# Patient Record
Sex: Male | Born: 1960 | Race: White | Hispanic: No | Marital: Married | Smoking: Never smoker
Health system: Southern US, Community
[De-identification: ages and names within clinical notes are randomized; demographics above are authoritative.]

## PROBLEM LIST (undated history)

## (undated) DIAGNOSIS — E119 Type 2 diabetes mellitus without complications: Secondary | ICD-10-CM

## (undated) DIAGNOSIS — M1990 Unspecified osteoarthritis, unspecified site: Secondary | ICD-10-CM

## (undated) DIAGNOSIS — R7303 Prediabetes: Secondary | ICD-10-CM

## (undated) DIAGNOSIS — K219 Gastro-esophageal reflux disease without esophagitis: Secondary | ICD-10-CM

## (undated) DIAGNOSIS — E785 Hyperlipidemia, unspecified: Secondary | ICD-10-CM

## (undated) HISTORY — DX: Gastro-esophageal reflux disease without esophagitis: K21.9

## (undated) HISTORY — DX: Type 2 diabetes mellitus without complications: E11.9

## (undated) HISTORY — DX: Hyperlipidemia, unspecified: E78.5

---
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
## 2002-12-05 ENCOUNTER — Ambulatory Visit (HOSPITAL_BASED_OUTPATIENT_CLINIC_OR_DEPARTMENT_OTHER): Admission: RE | Admit: 2002-12-05 | Discharge: 2002-12-05 | Payer: Self-pay | Admitting: Orthopedic Surgery

## 2003-01-05 HISTORY — PX: ROTATOR CUFF REPAIR: SHX139

## 2005-12-10 ENCOUNTER — Encounter: Admission: RE | Admit: 2005-12-10 | Discharge: 2005-12-10 | Payer: Self-pay | Admitting: Family Medicine

## 2008-11-11 ENCOUNTER — Encounter: Admission: RE | Admit: 2008-11-11 | Discharge: 2008-11-11 | Payer: Self-pay | Admitting: Chiropractic Medicine

## 2009-04-11 ENCOUNTER — Encounter: Payer: Self-pay | Admitting: Internal Medicine

## 2009-10-30 ENCOUNTER — Encounter: Payer: Self-pay | Admitting: Cardiovascular Disease

## 2009-10-31 ENCOUNTER — Encounter (INDEPENDENT_AMBULATORY_CARE_PROVIDER_SITE_OTHER): Payer: Self-pay | Admitting: *Deleted

## 2009-10-31 ENCOUNTER — Telehealth (INDEPENDENT_AMBULATORY_CARE_PROVIDER_SITE_OTHER): Payer: Self-pay | Admitting: *Deleted

## 2009-11-03 ENCOUNTER — Ambulatory Visit: Payer: Self-pay | Admitting: Internal Medicine

## 2009-12-05 ENCOUNTER — Ambulatory Visit: Payer: Self-pay | Admitting: Internal Medicine

## 2009-12-06 ENCOUNTER — Emergency Department (HOSPITAL_BASED_OUTPATIENT_CLINIC_OR_DEPARTMENT_OTHER): Admission: EM | Admit: 2009-12-06 | Discharge: 2009-12-06 | Payer: Self-pay | Admitting: Emergency Medicine

## 2009-12-08 ENCOUNTER — Encounter: Admission: RE | Admit: 2009-12-08 | Discharge: 2009-12-08 | Payer: Self-pay | Admitting: Family Medicine

## 2009-12-10 ENCOUNTER — Encounter: Payer: Self-pay | Admitting: Internal Medicine

## 2010-01-21 ENCOUNTER — Telehealth: Payer: Self-pay | Admitting: Internal Medicine

## 2010-01-23 ENCOUNTER — Telehealth (INDEPENDENT_AMBULATORY_CARE_PROVIDER_SITE_OTHER): Payer: Self-pay | Admitting: *Deleted

## 2010-02-03 NOTE — Miscellaneous (Signed)
Summary: [facility] PV  Clinical Lists Changes  Observations: Added new observation of NKA: T (11/03/2009 15:54)

## 2010-02-03 NOTE — Letter (Signed)
Summary: EGD Instructions  Bryan [facility]  [address] [place], [place] [address]   Phone: [phone]  Fax: [phone]       Bryan Bradley    [birth year]    MRN: [id number]       Procedure Day /Date: Friday 12/05/2009     Arrival Time:  1:30 pm     Procedure Time:  2:30 pm     Location of Procedure:                    _ x _ [facility] (4th Floor)    PREPARATION FOR ENDOSCOPY   On Friday 12/2 THE DAY OF THE PROCEDURE:  1.   No solid foods, milk or milk products are allowed after midnight the night before your procedure.  2.   Do not drink anything colored red or purple.  Avoid juices with pulp.  No orange juice.  3.  You may drink clear liquids until 12:30 pm, which is 2 hours before your procedure.                                                                                                CLEAR LIQUIDS INCLUDE: Water Jello Ice Popsicles Tea (sugar ok, no milk/cream) Powdered fruit flavored drinks Coffee (sugar ok, no milk/cream) Gatorade Juice: apple, white grape, white cranberry  Lemonade Clear bullion, consomm, broth Carbonated beverages (any kind) Strained chicken noodle soup Hard Candy   MEDICATION INSTRUCTIONS  Unless otherwise instructed, you should take regular prescription medications with a small sip of water as early as possible the morning of your procedure.            OTHER INSTRUCTIONS  You will need a responsible adult at least 51 years of age to accompany you and drive you home.   This person must remain in the waiting room during your procedure.  Wear loose fitting clothing that is easily removed.  Leave jewelry and other valuables at home.  However, you may wish to bring a book to read or an iPod/MP3 player to listen to music as you wait for your procedure to start.  Remove all body piercing jewelry and leave at home.  Total time from sign-in until discharge is approximately 2-3 hours.  You should go  home directly after your procedure and rest.  You can resume normal activities the day after your procedure.  The day of your procedure you should not:   Drive   Make legal decisions   Operate machinery   Drink alcohol   Return to work  You will receive specific instructions about eating, activities and medications before you leave.    The above instructions have been reviewed and explained to me by   Ezra Sites RN  November 03, 2009 4:15 PM   I fully understand and can verbalize these instructions _____________________________ Date _________

## 2010-02-03 NOTE — Procedures (Signed)
Summary: Upper Endoscopy  Patient: Bryan Bradley Note: All result statuses are Final unless otherwise noted.  Tests: (1) Upper Endoscopy (EGD)   EGD Upper Endoscopy       DONE     [facility]     [address]     [place], [place]  [address]           ENDOSCOPY PROCEDURE REPORT           PATIENT:  Bryan Bradley, Bryan Bradley  MR#:  [id number]     BIRTHDATE:  [birth year], 49 yrs. old  GENDER:  male           ENDOSCOPIST:  Hedwig Morton. [name], MD     Referred by:  Blair Heys, M.D.           PROCEDURE DATE:  12/05/2009     PROCEDURE:  EGD with biopsy, 43239     ASA CLASS:  Class I     INDICATIONS:  GERD, abnormal imaging CT of the chest shows     thickened esophagus           MEDICATIONS:   Versed 6 mg, Fentanyl 50 mcg     TOPICAL ANESTHETIC:  Exactacain Spray           DESCRIPTION OF PROCEDURE:   After the risks benefits and     alternatives of the procedure were thoroughly explained, informed     consent was obtained.  The LB GIF-H180 [id number] endoscope was     introduced through the mouth and advanced to the second portion of     the duodenum, without limitations.  The instrument was slowly     withdrawn as the mucosa was fully examined.     <<PROCEDUREIMAGES>>           Esophagitis was found in the distal esophagus. severe ulcerations,     fibrosis and bleeding x 5 cm segment, no significant stricture     r/o Barrett's esophagus Multiple biopsies were obtained and sent     to pathology (see image1, image2, image3, image8, and image9).  A     hiatal hernia was found (see image7). 3-4 cm hiatal hernia 36-40cm     A sessile polyp was found. fundic gland polyps With standard     forceps, a biopsy was obtained and sent to pathology (see image6).     Otherwise the examination was normal (see image5).    Retroflexed     views revealed no abnormalities.    The scope was then withdrawn     from the patient and the procedure completed.           COMPLICATIONS:  None        ENDOSCOPIC IMPRESSION:     1) Esophagitis in the distal esophagus     2) Hiatal hernia     3) Sessile polyp     4) Otherwise normal examination     severe reflux esiphagitis, Grade 3, r/o Barrett's esophagus     RECOMMENDATIONS:     1) Await pathology results     Nexiem 40 mg, #30, 1 po qd, 3 refills           REPEAT EXAM:  In 0 year(s) for.           ______________________________     Hedwig Morton. [name], MD           CC:           n.  eSIGNED:   Hedwig Morton. Brodie at 12/05/2009 03:31 PM           Christia, [place], [phone]  Note: An exclamation mark (!) indicates a result that was not dispersed into the flowsheet. Document Creation Date: 12/05/2009 3:31 PM _______________________________________________________________________  (1) Order result status: Final Collection or observation date-time: 12/05/2009 15:19 Requested date-time:  Receipt date-time:  Reported date-time:  Referring Physician:   Ordering Physician: Lina Sar [phone]) Specimen Source:  Source: Launa Grill Order Number: [id number] Lab site:

## 2010-02-03 NOTE — Progress Notes (Signed)
  Phone Note Outgoing Call Call back at [facility] Phone [phone]   Call placed by: Jesse Fall RN,  October 31, 2009 3:55 PM Summary of Call: Patient scheduled for EGD on 11/07/09 @ 8:30 AM with 7:30 AM arrival as requested by Dr. [name]. Patient scheduled for pre visit on 11/03/09 @ 4 PM. Pt notified of appointments.

## 2010-02-03 NOTE — Letter (Signed)
Summary: Patient Notice-Endo Biopsy Results  [facility]  [address] [place], [place] [address]   Phone: [phone]  Fax: [phone]        December 10, 2009 MRN: [id number]    Regie MESTRE [address] [place], [place]  [address]    Dear Mr. GATT,  I am pleased to inform you that the biopsies taken during your recent endoscopic examination did not show any evidence of cancer upon pathologic examination.The biopsies show that You have rather severe reflux esophagitis. You are taking the right medicatiom for it. When You reduce Your Nexium to one/day, if Your reflux comes back, then You ought to go back to twice a day. We will send You a Prescription to Your drug store if You need one.  Additional information/recommendations:  __No further action is needed at this time.  Please follow-up with      your primary care physician for your other healthcare needs.  _x_ Please call [phone] to schedule a return visit to review      your condition.Please check with me in next 2 months to see if the treatment has been effective  _x_ Continue with the treatment plan as outlined on the day of your      exam.  __   Please call us if you are having persistent problems or have questions about your condition that have not been fully answered at this time.  Sincerely,  [name] MD  This letter has been electronically signed by your physician.  Appended Document: Patient Notice-Endo Biopsy Results Letter mailed

## 2010-02-03 NOTE — Miscellaneous (Signed)
  Clinical Lists Changes 

## 2010-02-05 NOTE — Progress Notes (Signed)
----   Converted from flag ---- ---- 01/23/2010 12:49 PM, [name] MD wrote: I was discussing EGD/colon with the pt. He received a large bill from Pathology after his EGD so he is not ready to have colon now.I told him to have colon within 2012, send him recall letter June 2012 and we will do anothe EGD at that time.  ---- 01/23/2010 11:12 AM, Jesse Fall RN wrote: Dr [name], I just want to be clear on this. Are you discussing the EGD/colon with the patient or Dr. [name]? I told Dr. [name] I would like him know when the procedure was due. Rene Kocher ------------------------------  Phone Note Other Incoming   Summary of Call: Per MD- Patient needs recall for EGD, Colon 06/2010. Recall in [facility]. Initial call taken by: Jesse Fall RN,  January 23, 2010 3:38 PM

## 2010-02-05 NOTE — Progress Notes (Signed)
  Phone Note Other Incoming Call back at [phone]   Caller: Dr [name] Summary of Call: Received a call from Dr. [name]. He would like to coordinate any repeat EGD with the patient's colon sreening. He would like to know if/when Dr. [name] want to repeat EGD.last EGD 12/05/09- reflux esophagitis. Please, advise. Initial call taken by: Jesse Fall RN,  January 21, 2010 11:04 AM  Follow-up for Phone Call        I have left him a message  ,we can coordinate EGD/colon. In next 4-8 weeks, I will call him to discuss. Follow-up by: [name] MD,  January 21, 2010 11:49 AM     Appended Document:  spoke with pt who is feeling fine, no heartburn. But says that he did not have much symptoms prior to the EGD either ( he had severe esophagitis on EGD). He will continue Nexium 40 mg by mouth once daily once daily. He will have a colonoscopy within 1 year and hwe will do the EGD again at that time.  he received a bill from [facility]. Pathology for $ 1000.00 and wants to know whether it was coded right. He is going to e-mail [place] the bills.  Appended Document:  Called to let Dr Randel Books office know that patient will be sent a recall letter for EGD and colon 06/2010.

## 2010-02-10 ENCOUNTER — Telehealth: Payer: Self-pay | Admitting: Internal Medicine

## 2010-02-19 NOTE — Progress Notes (Signed)
Summary: Nexium Rx Prior Authorization  ---- Converted from flag ----  -- 02/08/2010 11:14 AM, [name] MD wrote: Bryan Bradley, Bryan Bradley sent me an e-mail, letting me know that his Insurance company denied his Nexium 40 mg, 1 by mouth two times a day. He would like to appeal it. Could, You, please help him with it?  Thanks. I told him you would let him know and that he will need to find out the tel#  to contact the Insurance. ------------------------------ ---- 02/09/2010 9:33 AM, [name] CMA (AAMA) wrote: Well, the prescription was written by Dr [name], so that is why we were never contacted about insurance denial. Dr [name] wrote for Nexium 40 mg 1 tab ONCE daily, and that is what you also put in your procedure report. However, on the above flag, you asked that patient have two times a day dosing....do you really want two times a day dosing or once daily dosing?  ---- 02/09/2010 10:13 PM, [name] MD wrote: Let's start with once a day. x 1 year.  Phone Note Outgoing Call   Call placed by: [name] CMA Duncan Dull),  February 10, 2010 10:00 AM Call placed to: pharmacy Summary of Call: I have spoken to Pharmacist, Meyer Cory at [facility] I have asked him to discontinue prescription from Dr [name] for Nexium once daily. Instead, Dr [name] will be the prescriber and we have okayed Nexium 40 mg 1 tablet daily #30 with 10 refills. I am in the process of trying to get a prior authorization for patient. I have left a voicemail for patient to call back as I need to find out if he has tried any PPI's in the past. The only prescription I see that he has tried is Nexium. Initial call taken by: [name] CMA Duncan Dull),  February 10, 2010 10:05 AM  Follow-up for Phone Call        Patient states that he has only tried prilosec OTC. I have advised the patient that insurances require that he try at least 2 other PPI's before getting Nexium approval. However I will  try to get Nexium approved anyways. I have also advised patient that should Nexium be denied, I will find out if Dr [name] prefers he take Lansoprazole, pantoprazole or Zegerid (all preferred from insurance). Patient verbalizes understanding. Follow-up by: [name] CMA Duncan Dull),  February 10, 2010 11:50 AM  Additional Follow-up for Phone Call Additional follow up Details #1::        OK, we will try something that his insurance will cover. Additional Follow-up by: [name] MD,  February 10, 2010 4:48 PM     Appended Document: Nexium Rx Prior Authorization I have contacted patient's insurance company as I have yet to hear a decision from them regarding Nexium. Per patient's insurance compay, although Nexium prior auth was recent under a new physician and with more detailed information regarding his medical condition that the authorization will continue to be denied as patient must try at least 2 other PPI's prior to Nexium approval. Since patient has only tried Prilosec, we must try him on another medication. I have called in a new prescription for pantoprazole 40 mg 1 tablet daily #30 with 2 refills for patient in place of Nexium. Per pharmacy, prescription for pantoprazole will cost patient $13.00 copay. I have also called and spoken with patient to advise him that unfortunately, we were still unable to get coverage for Nexium but  did send a new prescription for pantoprazole instead. Patient verbalizes understanding.  Appended Document: Nexium Rx Prior Authorization excellent job!  If pantoprazole works, he may stay on it since it appears to be much cheaper.  Appended Document: Orders Update    Clinical Lists Changes  Medications: Added new medication of PANTOPRAZOLE SODIUM 40 MG TBEC (PANTOPRAZOLE SODIUM) Take 1 tablet by mouth once a day - Signed Rx of PANTOPRAZOLE SODIUM 40 MG TBEC (PANTOPRAZOLE SODIUM) Take 1 tablet by mouth once a day;  #30 x 2;  Signed;  Entered by: [name] CMA (AAMA);  Authorized by: [name] CMA (AAMA);  Method used: Telephoned to [facility]. #[id number]*, [address] [place], [place], [place]  [address], Ph: [phone], Fax: [phone]    Prescriptions: PANTOPRAZOLE SODIUM 40 MG TBEC (PANTOPRAZOLE SODIUM) Take 1 tablet by mouth once a day  #30 x 2   Entered and Authorized by:   [name] CMA (AAMA)   Signed by:   [name] CMA (AAMA) on 02/13/2010   Method used:   Telephoned to ...       [facility]  [place] [phone]* (retail)       [address]       [place], [place]  [address]       Ph: [phone]       Fax: [phone]   RxID:   [id number]

## 2010-02-25 NOTE — Medication Information (Signed)
Summary: Prior autho for Nexium/[employer]  Prior autho for Nexium/[employer]   Imported By: Sherian Rein 02/17/2010 12:40:18  _____________________________________________________________________  External Attachment:    Type:   Image     Comment:   External Document

## 2010-05-22 NOTE — Op Note (Signed)
NAMEJACOBI, NILE NO.:  [id number]   MEDICAL RECORD NO.:  [id number]                   PATIENT TYPE:  AMB   LOCATION:  DSC                                  FACILITY:  [facility]   PHYSICIAN:  Harvie Junior, M.D.                DATE OF BIRTH:  1960-11-20   DATE OF PROCEDURE:  12/05/2002  DATE OF DISCHARGE:                                 OPERATIVE REPORT   PREOPERATIVE DIAGNOSIS:  Impingement with acromioclavicular joint arthritis.   POSTOPERATIVE DIAGNOSIS:  1. Impingement with acromioclavicular joint arthritis.  2. Partial thickness rotator cuff tear undersurface.  3. Anterior labral tear with adhesive band over the biceps tendon.   PROCEDURE:  1. Anterior lateral acromioplasty through a lateral and posterior     compartment.  2. Distal clavicle resection through an anterior compartment.  3. Debridement of anterior labral tear with adhesive band over the biceps     and debridement of partial thickness rotator cuff tear undersurface both     within a glenohumeral joint.   SURGEON:  Harvie Junior, M.D.   ASSISTANT:  Marshia Ly, P.A.   ANESTHESIA:  General.   INDICATIONS:  The patient is a 50 year old male with a long history of  having right shoulder pain.  He ultimately has been evaluated multiple times  and over significant pain over the [facility] joint.  Injections had helped.  Because of continued complaints of pain, he is ultimately taken to the  operating room for shoulder arthroscopy, acromioplasty and distal clavicle  resection.   DESCRIPTION OF PROCEDURE:  The patient was taken to the operating room and  after adequate anesthesia was obtained with a general anesthetic, the  patient was placed supine on the operating room table and then moved to the  beach chair position.  All bony prominences were well padded.  Attention was  turned to the right shoulder where after routine prep and drape,  arthroscopic evaluation of the shoulder  showed that there as an interesting  fascial band over the biceps tendon which seemed to adhere the biceps tendon  through range of motion.  The band was identified and with a shaver it was  debrided.  The anterior labrum was identified and noted to have a slight  tear and this is where this band came from.  This was debrided at the  superior area.  The biceps tendon was grasped and tried to be forced and the  joint could not be forced.  It was anchored well superiorly.  The rotator  cuff was looked at and had a partial thickness tearing on the undersurface.  This was debrided with the suction shaver to stimulate blood in this area.  Following this, attention was turned out of the glenohumeral joint into the  subacromial space.  The anterior lateral spur was identified, and a takedown  of the anterior lateral spur was undertaken  with a motorized bur from the  lateral compartment initially and then from the posterior compartment with a  cutting block technique.  Following this, attention was turned to the distal  clavicle where the distal clavicle was resected over the length of 15 mm  from the anterior compartment.  Once this had been completed a final check  was made in this area.  A suction shaver was then used to debride the  rotator cuff superiorly.  Significant amounts of bursal inflammation were  taken off the rotator cuff superiorly through a full range of internal and  external rotation.  No significant rotator cuff pathology was identified  from the superior surface.  At this point, the shoulder was copiously  irrigated and suctioned dry.  The arthroscopic portals were closed with a  bandage.  A sterile compressive dressing was applied.  The patient was taken  to the recovery room where he was noted to be in satisfactory condition.  Estimated blood loss for the procedure was none.                                               Harvie Junior, M.D.    Ranae Plumber  D:  12/05/2002   T:  12/05/2002  Job:  [id number]

## 2010-06-19 ENCOUNTER — Telehealth: Payer: Self-pay | Admitting: *Deleted

## 2010-06-19 MED ORDER — OMEPRAZOLE 20 MG PO CPDR: 20.0000 mg | DELAYED_RELEASE_CAPSULE | Freq: Every day | ORAL | Status: DC

## 2010-06-19 NOTE — Telephone Encounter (Signed)
Dr [name] states that patient called and requested prescription for Prilosec (he was on pantoprazole). Per Dr [name], okay for patient to have Prilosec 20 mg, 1 tablet by mouth daily with 11 refills. Rx sent.

## 2010-06-22 ENCOUNTER — Encounter: Payer: Self-pay | Admitting: Internal Medicine

## 2011-05-24 ENCOUNTER — Encounter: Payer: Self-pay | Admitting: Internal Medicine

## 2011-10-07 ENCOUNTER — Encounter: Payer: Self-pay | Admitting: Internal Medicine

## 2011-10-23 ENCOUNTER — Other Ambulatory Visit: Payer: Self-pay | Admitting: Internal Medicine

## 2011-10-23 MED ORDER — OMEPRAZOLE 40 MG PO CPDR: 40.0000 mg | DELAYED_RELEASE_CAPSULE | Freq: Two times a day (BID) | ORAL | Status: DC

## 2011-11-23 ENCOUNTER — Encounter: Payer: Self-pay | Admitting: Internal Medicine

## 2011-11-23 ENCOUNTER — Ambulatory Visit (AMBULATORY_SURGERY_CENTER): Payer: PRIVATE HEALTH INSURANCE | Admitting: *Deleted

## 2011-11-23 VITALS — Ht 70.0 in | Wt 240.0 lb

## 2011-11-23 DIAGNOSIS — Z1211 Encounter for screening for malignant neoplasm of colon: Secondary | ICD-10-CM

## 2011-11-23 DIAGNOSIS — K2100 Gastro-esophageal reflux disease with esophagitis, without bleeding: Secondary | ICD-10-CM

## 2011-11-23 MED ORDER — MOVIPREP 100 G PO SOLR: ORAL | Status: DC

## 2011-12-07 ENCOUNTER — Ambulatory Visit (AMBULATORY_SURGERY_CENTER): Payer: PRIVATE HEALTH INSURANCE | Admitting: Internal Medicine

## 2011-12-07 ENCOUNTER — Encounter: Payer: Self-pay | Admitting: Internal Medicine

## 2011-12-07 VITALS — BP 117/77 | HR 56 | Temp 97.8°F | Resp 22 | Ht 70.0 in | Wt 240.0 lb

## 2011-12-07 DIAGNOSIS — K209 Esophagitis, unspecified without bleeding: Secondary | ICD-10-CM

## 2011-12-07 DIAGNOSIS — K219 Gastro-esophageal reflux disease without esophagitis: Secondary | ICD-10-CM

## 2011-12-07 DIAGNOSIS — Z1211 Encounter for screening for malignant neoplasm of colon: Secondary | ICD-10-CM

## 2011-12-07 MED ORDER — HYDROCORTISONE ACE-PRAMOXINE 1-1 % RE CREA: TOPICAL_CREAM | Freq: Two times a day (BID) | RECTAL | Status: DC

## 2011-12-07 MED ORDER — SODIUM CHLORIDE 0.9 % IV SOLN: 500.0000 mL | INTRAVENOUS | Status: DC

## 2011-12-07 NOTE — Progress Notes (Signed)
Patient did not experience any of the following events: a burn prior to discharge; a fall within the facility; wrong site/side/patient/procedure/implant event; or a hospital transfer or hospital admission upon discharge from the facility. (G8907) Patient did not have preoperative order for IV antibiotic SSI prophylaxis. (G8918)  

## 2011-12-07 NOTE — Patient Instructions (Addendum)
YOU HAD AN ENDOSCOPIC PROCEDURE TODAY AT THE [facility]: Refer to the procedure report that was given to you for any specific questions about what was found during the examination.  If the procedure report does not answer your questions, please call your gastroenterologist to clarify.  If you requested that your care partner not be given the details of your procedure findings, then the procedure report has been included in a sealed envelope for you to review at your convenience later.  YOU SHOULD EXPECT: Some feelings of bloating in the abdomen. Passage of more gas than usual.  Walking can help get rid of the air that was put into your GI tract during the procedure and reduce the bloating. If you had a lower endoscopy (such as a colonoscopy or flexible sigmoidoscopy) you may notice spotting of blood in your stool or on the toilet paper. If you underwent a bowel prep for your procedure, then you may not have a normal bowel movement for a few days.  DIET: Your first meal following the procedure should be a light meal and then it is ok to progress to your normal diet.  A half-sandwich or bowl of soup is an example of a good first meal.  Heavy or fried foods are harder to digest and may make you feel nauseous or bloated.  Likewise meals heavy in dairy and vegetables can cause extra gas to form and this can also increase the bloating.  Drink plenty of fluids but you should avoid alcoholic beverages for 24 hours.  ACTIVITY: Your care partner should take you home directly after the procedure.  You should plan to take it easy, moving slowly for the rest of the day.  You can resume normal activity the day after the procedure however you should NOT DRIVE or use heavy machinery for 24 hours (because of the sedation medicines used during the test).    SYMPTOMS TO REPORT IMMEDIATELY: A gastroenterologist can be reached at any hour.  During normal business hours, 8:30 AM to 5:00 PM Monday through Friday,  call [phone].  After hours and on weekends, please call the GI answering service at [phone] who will take a message and have the physician on call contact you.   Following lower endoscopy (colonoscopy or flexible sigmoidoscopy):  Excessive amounts of blood in the stool  Significant tenderness or worsening of abdominal pains  Swelling of the abdomen that is new, acute  Fever of 100F or higher  Following upper endoscopy (EGD)  Vomiting of blood or coffee ground material  New chest pain or pain under the shoulder blades  Painful or persistently difficult swallowing  New shortness of breath  Fever of 100F or higher  Black, tarry-looking stools  FOLLOW UP: If any biopsies were taken you will be contacted by phone or by letter within the next 1-3 weeks.  Call your gastroenterologist if you have not heard about the biopsies in 3 weeks.  Our staff will call the home number listed on your records the next business day following your procedure to check on you and address any questions or concerns that you may have at that time regarding the information given to you following your procedure. This is a courtesy call and so if there is no answer at the home number and we have not heard from you through the emergency physician on call, we will assume that you have returned to your regular daily activities without incident.  SIGNATURES/CONFIDENTIALITY: You and/or your care   partner have signed paperwork which will be entered into your electronic medical record.  These signatures attest to the fact that that the information above on your After Visit Summary has been reviewed and is understood.  Full responsibility of the confidentiality of this discharge information lies with you and/or your care-partner.  

## 2011-12-07 NOTE — Op Note (Signed)
[facility] [address] [place], [address]   ENDOSCOPY PROCEDURE REPORT  PATIENT: Geno, Sydnor.  MR#: [id number] BIRTHDATE: 12-Jul-1960 , 51  yrs. old GENDER: Male ENDOSCOPIST: [name], MD REFERRED BY:  Blair Heys, M.D. PROCEDURE DATE:  12/07/2011 PROCEDURE:  EGD w/ biopsy ASA CLASS:     Class I INDICATIONS:  History of esophageal reflux.   History of reflux esophagitis.   follow up of GERD.   last EGD 12/2009 Grade 3 esophagitis. MEDICATIONS: MAC sedation, administered by CRNA and propofol (Diprivan) 150mg  IV TOPICAL ANESTHETIC: none  DESCRIPTION OF PROCEDURE: After the risks benefits and alternatives of the procedure were thoroughly explained, informed consent was obtained.  The LB GIF-H180 [id number] endoscope was introduced through the mouth and advanced to the   . Without limitations.  The instrument was slowly withdrawn as the mucosa was fully examined.        ESOPHAGUS: Reflux esophagitis was found at the gastroesophageal junction.  Esophagitis was Savary-Miller Grade II: multiple lesions and folds, non-circum.  A biopsy was performed using cold forceps.   STOMACH: A hiatus hernia was found in the cardia.  A few cameron ulcers were found.  Retroflexed views revealed no abnormalities. The scope was then withdrawn from the patient and the procedure completed.  COMPLICATIONS: There were no complications. ENDOSCOPIC IMPRESSION: 1.   Esophagitis consistent with reflux esophagitis at the gastroesophageal junction; biopsy - Grade II, several 10 mm erosions, no stricture 2.   Hiatus hernia was found in the cardia - 37--40 cm, non reducoible , seversl linear erosions c/w Cameron erosions  RECOMMENDATIONS: 1.  Anti-reflux regimen to be follow 2.  Await biopsy results 3.  Continue PPI The endoscopic findingd suggest only partial control of the reflux with high dose Omeprazole 40 mg po bid. He is asymptomatic. he may be a candidate for  Nissen Fundoplication. he is a high risk for developing Barrett's esophagus.  REPEAT EXAM: no recall, consider Ba esophagram to assess motility  eSigned:  [name], MD 12/07/2011 1:59 PM   CC:  PATIENT NAME:  Warnie, Belair. MR#: [id number]

## 2011-12-07 NOTE — Op Note (Addendum)
[facility] [address] [place], [address]   COLONOSCOPY PROCEDURE REPORT  PATIENT: Bryan Bradley, Bryan Bradley.  MR#: [id number] BIRTHDATE: 01-19-1960 , 51  yrs. old GENDER: Male ENDOSCOPIST: [name], MD REFERRED BY:  Blair Heys, M.D. PROCEDURE DATE:  12/07/2011 PROCEDURE:   Colonoscopy, screening ASA CLASS:   Class I INDICATIONS:Average risk patient for colon cancer. MEDICATIONS: MAC sedation, administered by CRNA and propofol (Diprivan) 200mg  IV  DESCRIPTION OF PROCEDURE:   After the risks and benefits and of the procedure were explained, informed consent was obtained.  A digital rectal exam revealed no abnormalities of the rectum.    The LB CF-H180AL [id number]  endoscope was introduced through the anus and advanced to the cecum, which was identified by both the appendix and ileocecal valve .  The quality of the prep was good, using MoviPrep .  The instrument was then slowly withdrawn as the colon was fully examined.     COLON FINDINGS: A normal appearing cecum, ileocecal valve, and appendiceal orifice were identified.  The ascending, hepatic flexure, transverse, splenic flexure, descending, sigmoid colon and rectum appeared unremarkable.  No polyps or cancers were seen. Retroflexed views revealed no abnormalities.     The scope was then withdrawn from the patient and the procedure completed.  COMPLICATIONS: There were no complications. ENDOSCOPIC IMPRESSION: Normal colon ,no significant hemorrhoids  RECOMMENDATIONS: High fiber diet Analpram cream 2.5%   REPEAT EXAM: In 10 year(s)  for Colonoscopy.  cc:  _______________________________ eSignedHart Carwin, MD 12/07/2011 2:17 PM     PATIENT NAME:  [name], [name]. MR#: [id number]

## 2011-12-08 ENCOUNTER — Other Ambulatory Visit: Payer: Self-pay | Admitting: *Deleted

## 2011-12-08 ENCOUNTER — Telehealth: Payer: Self-pay | Admitting: *Deleted

## 2011-12-08 DIAGNOSIS — K219 Gastro-esophageal reflux disease without esophagitis: Secondary | ICD-10-CM

## 2011-12-08 NOTE — Telephone Encounter (Signed)
Per Dr. [name] schedule barium esophagram with UGI series. Scheduled patient on [facility] radiology 12/14/11 at 9:15/9:30 AM. NPO after midnight.Bryan Bradley) Patient given appointment date/time/instructions. He will need to reschedule. Phone number given to patient to reschedule.

## 2011-12-08 NOTE — Telephone Encounter (Signed)
Left message on number given in admitting yesterday. ewm 

## 2011-12-13 ENCOUNTER — Encounter: Payer: Self-pay | Admitting: Internal Medicine

## 2011-12-14 ENCOUNTER — Other Ambulatory Visit (HOSPITAL_COMMUNITY): Payer: PRIVATE HEALTH INSURANCE

## 2011-12-23 ENCOUNTER — Ambulatory Visit (HOSPITAL_COMMUNITY)
Admission: RE | Admit: 2011-12-23 | Discharge: 2011-12-23 | Disposition: A | Discharge status: Home / Self Care | Payer: PRIVATE HEALTH INSURANCE | Source: Ambulatory Visit | Attending: Internal Medicine | Admitting: Internal Medicine

## 2011-12-23 DIAGNOSIS — K449 Diaphragmatic hernia without obstruction or gangrene: Secondary | ICD-10-CM | POA: Insufficient documentation

## 2011-12-23 DIAGNOSIS — K219 Gastro-esophageal reflux disease without esophagitis: Secondary | ICD-10-CM

## 2012-09-15 ENCOUNTER — Other Ambulatory Visit: Payer: Self-pay | Admitting: *Deleted

## 2012-09-15 MED ORDER — OMEPRAZOLE 40 MG PO CPDR: 40.0000 mg | DELAYED_RELEASE_CAPSULE | Freq: Two times a day (BID) | ORAL | Status: DC

## 2013-03-26 ENCOUNTER — Encounter: Payer: PRIVATE HEALTH INSURANCE | Attending: Family Medicine | Admitting: Dietician

## 2013-03-26 ENCOUNTER — Encounter: Payer: Self-pay | Admitting: Dietician

## 2013-03-26 VITALS — Ht 69.0 in | Wt 234.7 lb

## 2013-03-26 DIAGNOSIS — Z713 Dietary counseling and surveillance: Secondary | ICD-10-CM | POA: Insufficient documentation

## 2013-03-26 DIAGNOSIS — E669 Obesity, unspecified: Secondary | ICD-10-CM

## 2013-03-26 DIAGNOSIS — E663 Overweight: Secondary | ICD-10-CM | POA: Insufficient documentation

## 2013-03-26 NOTE — Patient Instructions (Signed)
Consider reading/listening to Morgan StanleyMindless Eating or Slim by Design by Lynnell GrainBrian Wansink. Fill up half of your plate with vegetables and limit carbohydrates and protein to a quarter of plate. Continue exercising most days of the week. Think about portioning out snacks ahead of time. If you want to have a snack think about having peanut butter crackers.

## 2013-03-26 NOTE — Progress Notes (Signed)
Appt start time: 1130 end time:  1230.   Assessment: Bryan Bradley is here today help managing blood sugar and weight loss. States that he got married in the past year which caused him to eat more junk and have less time to exercise. Lives with his wife, and 2 stepdaughters, and one daughter. Eats a lot of prepared foods or goes out to eat since he and his wife work a lot. Would like to lose about 20 lbs.  Starting making some changes such as starting to exercise more and lost a couple of lbs and lost a few inches in his waist. Feels like he gained more muscle.   Trying to eat less food at night such as croissants and cupcakes which are at his home for his stepdaughter.   Current HbA1c: 8.2 %. Not currently testing is blood sugar and says his doctor has not mentioned it.    Preferred Learning Style:   No preference indicated   Learning Readiness:   Ready  MEDICATIONS: Metformin added when Hgb A1c increased to 8.2%   DIETARY INTAKE:  24-hr recall:  B ( AM): protein bar or protein shake with kale or chick fil a oatmeal or scrambled egg bowl with coffee mostly black or water Snk ( AM): depends Atkins bar  L ( PM): sandwich or salad from [employer] or [employer] (greens, cottages, sunflower sees) or protein + vegetable Snk ( PM): none D (7-7:30PM): protein and vegetables, out to eat 3 x week   Snk (9-10PM): cupcakes, pop tart, popcorn, ice cream Beverages: water, Diet Coke   Usual physical activity: 2 x week trainer and cardio at house 3 x week for 45 minutes  Estimated energy needs: 2000 calories 225 g carbohydrates 150 g protein 56 g fat  Progress Towards Goal(s):  In progress.   Nutritional Diagnosis:  Boron-3.3 Overweight/obesity As related to hx of late night snacking and lack of physical activity.  As evidenced by BMI of 34.7.    Intervention:  Nutrition counseling provided.  Plan: Consider reading/listening to Morgan StanleyMindless Eating or Slim by Design by Lynnell GrainBrian Wansink. Fill up  half of your plate with vegetables and limit carbohydrates and protein to a quarter of plate. Continue exercising most days of the week. Think about portioning out snacks ahead of time. If you want to have a snack think about having peanut butter crackers.   Teaching Method Utilized:  Visual Auditory Hands on  Handouts given during visit include:  Living Well With Diabetes  Yellow Card  MyPlate Handout  15 g CHO Snacks  Barriers to learning/adherence to lifestyle change: none  Demonstrated degree of understanding via:  Teach Back   Monitoring/Evaluation:  Dietary intake, exercise, and body weight prn.

## 2013-11-06 ENCOUNTER — Encounter: Payer: Self-pay | Admitting: Sports Medicine

## 2013-11-06 ENCOUNTER — Ambulatory Visit (INDEPENDENT_AMBULATORY_CARE_PROVIDER_SITE_OTHER): Payer: PRIVATE HEALTH INSURANCE | Admitting: Sports Medicine

## 2013-11-06 DIAGNOSIS — M722 Plantar fascial fibromatosis: Secondary | ICD-10-CM

## 2013-11-06 DIAGNOSIS — M79671 Pain in right foot: Secondary | ICD-10-CM

## 2013-11-06 DIAGNOSIS — S46292A Other injury of muscle, fascia and tendon of other parts of biceps, left arm, initial encounter: Secondary | ICD-10-CM

## 2013-11-06 DIAGNOSIS — M6789 Other specified disorders of synovium and tendon, multiple sites: Secondary | ICD-10-CM

## 2013-11-06 DIAGNOSIS — M6790 Unspecified disorder of synovium and tendon, unspecified site: Secondary | ICD-10-CM | POA: Insufficient documentation

## 2013-11-06 MED ORDER — NITROGLYCERIN 0.2 MG/HR TD PT24: MEDICATED_PATCH | TRANSDERMAL | Status: DC

## 2013-11-06 NOTE — Assessment & Plan Note (Addendum)
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 We were able to localize an area of scar tissue within the muscle belly of the biceps approximately 1 cm from they muscle tendinous junction.  Recommendations: -Started patient on nitroglycerin protocol to help with remodeling of this scar tissue Provided him with a handout of exercises that included low weight high reps of biceps curls, supination -plan to follow-up in 4 weeks to reassess with ultrasound

## 2013-11-06 NOTE — Assessment & Plan Note (Signed)
Patient has a tender nodule at the flexor tendon with no signs of trigger. At this time will treat him with activity modification to reduce irritation this tendon with wearing gloves weight lifting, golfing and preventing activities were is needing to use his fingers for gripping. Advised the patient if he develops any triggering to this finger we can attempt an injection in the future

## 2013-11-06 NOTE — Progress Notes (Signed)
Bryan EastDavid J Meisinger - 53 y.o. male MRN [id number]  Date of birth: 13-Oct-1960  SUBJECTIVE:  Including CC & ROS.  Patient's a 53 year old pleasant gentleman presenting today with several complaints including pain at the plantar aspect of his right foot, tenderness at the proximal biceps, and pain in the palmar aspect of his right hand along the third digit.  Physical activities: Include weight training, working with a Systems analystpersonal trainer, and some elliptical. 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   Foot pain: Patient describes several month history of pain along the arch of his right foot with significant point tenderness at the medial aspect of the calcaneus insertion. Patient has worse pain at night and in the mornings with walking. At this point in time he has not tried any icing, medications, or stretching.   Left shoulder/biceps: Patient denies any known history of a mechanism of injury to his biceps muscle. But however over the last 6 weeks he's noticed tenderness at the proximal one third of the muscle belly with any lifting, push ups, or weight training. Again he is not tried any modalities such as medication ice or stretching.  Right hand pain:  presenting for approximately 3 weeks. Patient localizes his pain to the A1 pulley of the third digit. Denies any triggering or locking of the tendon but does have a tender nodule in this area. Denies any swelling. Pain is worsened by activities he does with a trainer on the palms of his hand, when golfing, and doing weight lifting. Again he has not tried any conservative modalities at this point for this injury.   ROS: Review of systems otherwise negative except for information present in HPI  HISTORY: Past Medical, Surgical, Social, and Family History Reviewed & Updated per EMR. Pertinent Historical Findings include: Nonsmoker Hyperlipidemia Diabetes  DATA REVIEWED: No previous imaging data available  PHYSICAL EXAM:  VS: BP:134/86 mmHg  HR:77bpm   TEMP: ( )  RESP:   HT:5\' 9"  (175.3 cm)   WT:235 lb (106.595 kg)  BMI:34.8 FOOT EXAM:  General: well nourished Skin of LE: warm; dry, no rashes, lesions, ecchymosis or erythema. Vascular: Dorsal pedal pulses 2+ bilaterally Neurologically: Sensation to light touch lower extremities equal and intact bilaterally.  Observation - no ecchymosis, no edema, or hematoma present  Palpation: tenderness over the medial attachment of the plantar fascia to the calcaneus Normal ankle motion and strength bilaterally  Extension/flexion 5/5 strength bilaterally in toes Weight-bearing foot exam:  First ray: neutral  Second ray: splaying toward the lateral three toes Longitudinal arch: mild collapse Heel position: neutral Lateral arch collapse with breakdown with Subluxation of the 5th MTP  SHOULDER EXAM:  General: well nourished Skin of UE: warm; dry, no rashes, lesions, ecchymosis or erythema. Vascular: radial pulses 2+ bilaterally Neurologically: Normal sensation with no sensory or motor defects in C4-C8, bilateral Palpation: no tenderness over the [facility] joint, acromion, no bicipital grove tenderness, no supraspinatus tenderness TTP over a proximal portion of the bicep muscle ROM active/passive: symmetric full 180 degree of abduction and forward flexion, symmetric internal (80-90) and external rotation (90) with shoulder at 90 abduction. Appley's scratch test equal bilaterally Strength testing: 5/5 symmetric strength in internal and external rotation, forward flexion, adduction and abduction Bicep test 5/5 symmetric in neutral curl and supination, painful with cross body testing     Special Test: Negative Neer's, neg Hawkins, negative empty can, Positive O'Brien, neg speeds, negative yergason test  HAND EXAM:     Observation: no swelling, no erythema, no  bruising  Palpation: tenderness to palpation over the A1 pulley of the third digit with nodular formation within the flexor tendon   ROM: normal ROM,  with no triggering    Strength: No pain 5/5 strength of each digit in flexion and extension   Special test: stable medial and lateral collateral ligaments of each finger  MSK US: Foot ultrasound: Evidence of patella fascial thickening approximately 1 cm from the insertion on the medial calcaneous indicating an area of thickening is only slightly greater than the normal fascia. The tendon was approximately 0.49 cm at the proximal attachment and at the thickened region it was approximately 0.59 cm.  Ultrasound of the shoulder: Reveals intact rotator cuff muscles with only a small calcification within the supraspinatus. Biceps tendon in the bicipital groove, an area of scar tissue buildup is present in the proximal biceps approximately 1 cm away from the muscle tenderness junction.  ASSESSMENT & PLAN: See problem based charting & AVS for pt instructions.

## 2013-11-06 NOTE — Assessment & Plan Note (Addendum)
Recommendations: - Provided patient with scaphoid pads to place and issues for additional arch support - Provided patient with arch strap for additional compression and arch support -Provided patient with home exercises that included plantar fascial stretching, he'll walks, toe walks, and heel drops -plan to follow-up in 4 weeks to reassess with ultrasound

## 2013-11-06 NOTE — Patient Instructions (Signed)

## 2013-11-07 ENCOUNTER — Other Ambulatory Visit: Payer: Self-pay | Admitting: *Deleted

## 2013-11-07 MED ORDER — OMEPRAZOLE 40 MG PO CPDR: 40.0000 mg | DELAYED_RELEASE_CAPSULE | Freq: Two times a day (BID) | ORAL | Status: DC

## 2013-12-04 ENCOUNTER — Ambulatory Visit (INDEPENDENT_AMBULATORY_CARE_PROVIDER_SITE_OTHER): Payer: PRIVATE HEALTH INSURANCE | Admitting: Sports Medicine

## 2013-12-04 ENCOUNTER — Encounter: Payer: Self-pay | Admitting: Sports Medicine

## 2013-12-04 VITALS — BP 131/83

## 2013-12-04 DIAGNOSIS — S46292A Other injury of muscle, fascia and tendon of other parts of biceps, left arm, initial encounter: Secondary | ICD-10-CM

## 2013-12-04 DIAGNOSIS — M722 Plantar fascial fibromatosis: Secondary | ICD-10-CM

## 2013-12-04 NOTE — Assessment & Plan Note (Signed)
No pain or limitation on testing  Continue conservative rehabilitation exercises  Continue with nitroglycerin patches since these had no side effects  Rescan this in about 6 weeks

## 2013-12-04 NOTE — Progress Notes (Signed)
Patient ID: Bryan EastDavid J Bradley, male   DOB: 1960-11-22, 53 y.o.   MRN: [id number]  Patient comes in for followup of 2 issues  Left biceps strain with some scar tissue We discussed this and he will continue using rehabilitation exercises No limitation of his normal activity from this  Second issue is plantar fasciitis with some foot pain He did get good relief using scaphoid pad for arch support He is doing exercises and stretches

## 2013-12-04 NOTE — Assessment & Plan Note (Signed)
This is improving fairly rapidly as long as he uses good arch support   plan to make him some orthotics for his dress shoes and his workout shoes to give him better support

## 2014-01-09 ENCOUNTER — Ambulatory Visit (INDEPENDENT_AMBULATORY_CARE_PROVIDER_SITE_OTHER): Payer: PRIVATE HEALTH INSURANCE | Admitting: Sports Medicine

## 2014-01-09 DIAGNOSIS — M722 Plantar fascial fibromatosis: Secondary | ICD-10-CM

## 2014-01-10 NOTE — Progress Notes (Signed)
Patient ID: Bryan Bradley, male   DOB: [birth year], 54 y.o.   MRN: [id number]   Patient comes in today for custom orthotics. He is requesting two pairs of dress orthotics. He has a history of plantar fasciitis. Please see Dr. Darrick PennaFields' notes from his previous visits for details regarding history and physical.  Two pair of dress orthotics were constructed today. Instead of the blue EVA base I decided to instead use the smaller blue poron to ensure a better fit in his dress shoes. Patient found them to be comfortable prior to leaving the office. He would like to return to the office in 2-3 weeks to have orthotics for his tennis shoes constructed.  Total of 40 minutes was spent with the patient with greater than 50% of the time spent in face-to-face consultation discussing orthotic construction, instruction, and fitting.  Patient was fitted for a : standard, cushioned, semi-rigid orthotic. The orthotic was heated and afterward the patient stood on the orthotic blank positioned on the orthotic stand. The patient was positioned in subtalar neutral position and 10 degrees of ankle dorsiflexion in a weight bearing stance. After completion of molding, a stable base was applied to the orthotic blank. The blank was ground to a stable position for weight bearing. Size:11 dress orthotics Base: light blue poron Posting: none Additional orthotic padding: none  Patient was fitted for a : standard, cushioned, semi-rigid orthotic. The orthotic was heated and afterward the patient stood on the orthotic blank positioned on the orthotic stand. The patient was positioned in subtalar neutral position and 10 degrees of ankle dorsiflexion in a weight bearing stance. After completion of molding, a stable base was applied to the orthotic blank. The blank was ground to a stable position for weight bearing. Size: 11 dress orthotics Base: light blue poron Posting: none Additional orthotic padding: none

## 2014-01-23 ENCOUNTER — Ambulatory Visit (INDEPENDENT_AMBULATORY_CARE_PROVIDER_SITE_OTHER): Payer: PRIVATE HEALTH INSURANCE | Admitting: Sports Medicine

## 2014-01-23 ENCOUNTER — Encounter: Payer: Self-pay | Admitting: Sports Medicine

## 2014-01-23 VITALS — BP 148/87 | HR 85 | Ht 69.0 in | Wt 230.0 lb

## 2014-01-23 DIAGNOSIS — M722 Plantar fascial fibromatosis: Secondary | ICD-10-CM

## 2014-01-24 NOTE — Progress Notes (Signed)
Patient ID: Bryan EastDavid J Bradley, male   DOB: February 04, 1960, 54 y.o.   MRN: [id number]  Patient came in today to discuss orthotics for his tennis shoes. However, he tells me that the dress shoe orthotics that we made a couple of weeks ago seem to be working well in his tennis shoes. For the time being he would like to simply stick with the orthotics that he already has. Follow-up when necessary.

## 2015-07-10 ENCOUNTER — Telehealth: Payer: Self-pay | Admitting: Gastroenterology

## 2015-07-11 MED ORDER — OMEPRAZOLE 40 MG PO CPDR: 40.0000 mg | DELAYED_RELEASE_CAPSULE | Freq: Two times a day (BID) | ORAL | Status: DC

## 2015-07-11 NOTE — Telephone Encounter (Signed)
Yes, one month supply.

## 2015-07-11 NOTE — Telephone Encounter (Signed)
Rx filled for one month.

## 2015-07-11 NOTE — Telephone Encounter (Signed)
Pt is requesting refills on Omeprazole 40 mg BID. Last filled by Dr Juanda ChanceBrodie in 2015. Pt has never been seen in clinic. Last procedure visit was 2013. He has an upcoming office visit to establish care on 08-08-2015.

## 2015-07-22 DIAGNOSIS — R310 Gross hematuria: Secondary | ICD-10-CM | POA: Diagnosis not present

## 2015-08-08 ENCOUNTER — Ambulatory Visit (INDEPENDENT_AMBULATORY_CARE_PROVIDER_SITE_OTHER): Payer: BLUE CROSS/BLUE SHIELD | Admitting: Gastroenterology

## 2015-08-08 ENCOUNTER — Encounter: Payer: Self-pay | Admitting: Gastroenterology

## 2015-08-08 VITALS — BP 114/70 | HR 84 | Ht 68.75 in | Wt 228.0 lb

## 2015-08-08 DIAGNOSIS — K219 Gastro-esophageal reflux disease without esophagitis: Secondary | ICD-10-CM | POA: Diagnosis not present

## 2015-08-08 DIAGNOSIS — K449 Diaphragmatic hernia without obstruction or gangrene: Secondary | ICD-10-CM | POA: Diagnosis not present

## 2015-08-08 NOTE — Patient Instructions (Addendum)
If you are age 56 or older, your body mass index should be between 23-30. Your Body mass index is 33.92 kg/m. If this is out of the aforementioned range listed, please consider follow up with your Primary Care Provider.  If you are age 28 or younger, your body mass index should be between 19-25. Your Body mass index is 33.92 kg/m. If this is out of the aformentioned range listed, please consider follow up with your Primary Care Provider.   We have sent the following medications to your pharmacy for you to pick up at your convenience: Omeprazole  Thank you for choosing [facility]  Dr Amada Jupiter III

## 2015-08-08 NOTE — Progress Notes (Signed)
[facility] Progress Note  Chief Complaint: GERD  Subjective  History:  Bryan Bradley last saw Dr. [name] in December 2013, at which time he had a normal screening colonoscopy, and an upper endoscopy showing hiatal hernia with [name] erosions. He recently suffered from frequent heartburn and regurgitation, but says it is now much better after he lost some weight, changes diet and lifestyle, elevated his head of bed. He now only needs the 40 mg omeprazole 2 or 3 times per week. He denies dysphagia, odynophagia, nausea, vomiting, or early satiety.  ROS: Cardiovascular:  no chest pain Respiratory: no dyspnea  The patient's Past Medical, Family and Social History were reviewed and are on file in the EMR. Of note, his twin brother [name] is a local cardiologist Objective:  Med list reviewed  Vital signs in last 24 hrs: Vitals:   08/08/15 1135  BP: 114/70  Pulse: 84    Physical Exam  Well-appearing middle-aged man  HEENT: sclera anicteric, oral mucosa moist without lesions  Neck: supple, no thyromegaly, JVD or lymphadenopathy  Cardiac: RRR without murmurs, S1S2 heard, no peripheral edema  Pulm: clear to auscultation bilaterally, normal RR and effort noted  Abdomen: soft, No tenderness, with active bowel sounds. No guarding or palpable hepatosplenomegaly.  Skin; warm and dry, no jaundice or rash    @ASSESSMENTPLANBEGIN @ Assessment: Encounter Diagnoses  Name Primary?  . Gastroesophageal reflux disease without esophagitis Yes  . Hiatal hernia    He has minor symptoms now, and I think it is appropriate for him to scale back the frequency of PPI dosing. No Barrett's esophagus was seen on his 2013 EGD.   Plan: I refilled his omeprazole 40 mg to take 2 or 3 times a week, he will continue healthy diet and lifestyle and we'll see me in a year or sooner as needed.   Total time 20 minutes, over half spent in counseling and coordination of care.  Topics discussed: Chronic management  of GERD  [name]

## 2016-05-19 DIAGNOSIS — E7800 Pure hypercholesterolemia, unspecified: Secondary | ICD-10-CM | POA: Diagnosis not present

## 2016-05-19 DIAGNOSIS — E119 Type 2 diabetes mellitus without complications: Secondary | ICD-10-CM | POA: Diagnosis not present

## 2016-05-20 DIAGNOSIS — R310 Gross hematuria: Secondary | ICD-10-CM | POA: Diagnosis not present

## 2016-05-25 DIAGNOSIS — E291 Testicular hypofunction: Secondary | ICD-10-CM | POA: Diagnosis not present

## 2016-11-11 DIAGNOSIS — M79672 Pain in left foot: Secondary | ICD-10-CM | POA: Diagnosis not present

## 2016-11-23 DIAGNOSIS — E119 Type 2 diabetes mellitus without complications: Secondary | ICD-10-CM | POA: Diagnosis not present

## 2016-11-23 DIAGNOSIS — E7800 Pure hypercholesterolemia, unspecified: Secondary | ICD-10-CM | POA: Diagnosis not present

## 2016-11-23 DIAGNOSIS — Z23 Encounter for immunization: Secondary | ICD-10-CM | POA: Diagnosis not present

## 2016-11-30 DIAGNOSIS — E291 Testicular hypofunction: Secondary | ICD-10-CM | POA: Diagnosis not present

## 2017-02-24 DIAGNOSIS — E1165 Type 2 diabetes mellitus with hyperglycemia: Secondary | ICD-10-CM | POA: Diagnosis not present

## 2017-05-25 DIAGNOSIS — Z125 Encounter for screening for malignant neoplasm of prostate: Secondary | ICD-10-CM | POA: Diagnosis not present

## 2017-05-25 DIAGNOSIS — E7800 Pure hypercholesterolemia, unspecified: Secondary | ICD-10-CM | POA: Diagnosis not present

## 2017-05-25 DIAGNOSIS — N529 Male erectile dysfunction, unspecified: Secondary | ICD-10-CM | POA: Diagnosis not present

## 2017-05-25 DIAGNOSIS — E291 Testicular hypofunction: Secondary | ICD-10-CM | POA: Diagnosis not present

## 2017-05-25 DIAGNOSIS — E119 Type 2 diabetes mellitus without complications: Secondary | ICD-10-CM | POA: Diagnosis not present

## 2017-06-21 DIAGNOSIS — M7051 Other bursitis of knee, right knee: Secondary | ICD-10-CM | POA: Diagnosis not present

## 2017-06-21 DIAGNOSIS — M7052 Other bursitis of knee, left knee: Secondary | ICD-10-CM | POA: Diagnosis not present

## 2017-06-28 DIAGNOSIS — M7052 Other bursitis of knee, left knee: Secondary | ICD-10-CM | POA: Diagnosis not present

## 2017-06-28 DIAGNOSIS — M7051 Other bursitis of knee, right knee: Secondary | ICD-10-CM | POA: Diagnosis not present

## 2017-07-01 DIAGNOSIS — M7052 Other bursitis of knee, left knee: Secondary | ICD-10-CM | POA: Diagnosis not present

## 2017-07-01 DIAGNOSIS — M7051 Other bursitis of knee, right knee: Secondary | ICD-10-CM | POA: Diagnosis not present

## 2017-07-05 DIAGNOSIS — M7052 Other bursitis of knee, left knee: Secondary | ICD-10-CM | POA: Diagnosis not present

## 2017-07-05 DIAGNOSIS — M7051 Other bursitis of knee, right knee: Secondary | ICD-10-CM | POA: Diagnosis not present

## 2017-07-08 DIAGNOSIS — M7051 Other bursitis of knee, right knee: Secondary | ICD-10-CM | POA: Diagnosis not present

## 2017-07-08 DIAGNOSIS — M7052 Other bursitis of knee, left knee: Secondary | ICD-10-CM | POA: Diagnosis not present

## 2017-07-14 DIAGNOSIS — M7051 Other bursitis of knee, right knee: Secondary | ICD-10-CM | POA: Diagnosis not present

## 2017-07-14 DIAGNOSIS — M7052 Other bursitis of knee, left knee: Secondary | ICD-10-CM | POA: Diagnosis not present

## 2017-07-19 DIAGNOSIS — M7051 Other bursitis of knee, right knee: Secondary | ICD-10-CM | POA: Diagnosis not present

## 2017-07-19 DIAGNOSIS — M7052 Other bursitis of knee, left knee: Secondary | ICD-10-CM | POA: Diagnosis not present

## 2017-07-21 DIAGNOSIS — M7051 Other bursitis of knee, right knee: Secondary | ICD-10-CM | POA: Diagnosis not present

## 2017-07-21 DIAGNOSIS — M7052 Other bursitis of knee, left knee: Secondary | ICD-10-CM | POA: Diagnosis not present

## 2017-07-27 DIAGNOSIS — M7052 Other bursitis of knee, left knee: Secondary | ICD-10-CM | POA: Diagnosis not present

## 2017-07-27 DIAGNOSIS — M7051 Other bursitis of knee, right knee: Secondary | ICD-10-CM | POA: Diagnosis not present

## 2017-07-29 DIAGNOSIS — M7551 Bursitis of right shoulder: Secondary | ICD-10-CM | POA: Diagnosis not present

## 2017-07-29 DIAGNOSIS — M7052 Other bursitis of knee, left knee: Secondary | ICD-10-CM | POA: Diagnosis not present

## 2017-08-17 DIAGNOSIS — M7051 Other bursitis of knee, right knee: Secondary | ICD-10-CM | POA: Diagnosis not present

## 2017-08-17 DIAGNOSIS — M7052 Other bursitis of knee, left knee: Secondary | ICD-10-CM | POA: Diagnosis not present

## 2017-08-22 DIAGNOSIS — M7052 Other bursitis of knee, left knee: Secondary | ICD-10-CM | POA: Diagnosis not present

## 2017-08-22 DIAGNOSIS — M7051 Other bursitis of knee, right knee: Secondary | ICD-10-CM | POA: Diagnosis not present

## 2017-08-24 DIAGNOSIS — M7051 Other bursitis of knee, right knee: Secondary | ICD-10-CM | POA: Diagnosis not present

## 2017-08-24 DIAGNOSIS — M7052 Other bursitis of knee, left knee: Secondary | ICD-10-CM | POA: Diagnosis not present

## 2017-10-12 DIAGNOSIS — D3613 Benign neoplasm of peripheral nerves and autonomic nervous system of lower limb, including hip: Secondary | ICD-10-CM | POA: Diagnosis not present

## 2017-10-12 DIAGNOSIS — D485 Neoplasm of uncertain behavior of skin: Secondary | ICD-10-CM | POA: Diagnosis not present

## 2017-10-19 DIAGNOSIS — M545 Low back pain: Secondary | ICD-10-CM | POA: Diagnosis not present

## 2017-11-30 DIAGNOSIS — Z23 Encounter for immunization: Secondary | ICD-10-CM | POA: Diagnosis not present

## 2017-11-30 DIAGNOSIS — N529 Male erectile dysfunction, unspecified: Secondary | ICD-10-CM | POA: Diagnosis not present

## 2017-11-30 DIAGNOSIS — E291 Testicular hypofunction: Secondary | ICD-10-CM | POA: Diagnosis not present

## 2017-11-30 DIAGNOSIS — E7800 Pure hypercholesterolemia, unspecified: Secondary | ICD-10-CM | POA: Diagnosis not present

## 2017-11-30 DIAGNOSIS — E119 Type 2 diabetes mellitus without complications: Secondary | ICD-10-CM | POA: Diagnosis not present

## 2018-01-10 DIAGNOSIS — Z125 Encounter for screening for malignant neoplasm of prostate: Secondary | ICD-10-CM | POA: Diagnosis not present

## 2018-01-10 DIAGNOSIS — E291 Testicular hypofunction: Secondary | ICD-10-CM | POA: Diagnosis not present

## 2018-01-17 DIAGNOSIS — N5201 Erectile dysfunction due to arterial insufficiency: Secondary | ICD-10-CM | POA: Diagnosis not present

## 2018-01-17 DIAGNOSIS — E291 Testicular hypofunction: Secondary | ICD-10-CM | POA: Diagnosis not present

## 2018-01-25 DIAGNOSIS — M9902 Segmental and somatic dysfunction of thoracic region: Secondary | ICD-10-CM | POA: Diagnosis not present

## 2018-01-25 DIAGNOSIS — M9904 Segmental and somatic dysfunction of sacral region: Secondary | ICD-10-CM | POA: Diagnosis not present

## 2018-01-25 DIAGNOSIS — M9903 Segmental and somatic dysfunction of lumbar region: Secondary | ICD-10-CM | POA: Diagnosis not present

## 2018-01-25 DIAGNOSIS — M5386 Other specified dorsopathies, lumbar region: Secondary | ICD-10-CM | POA: Diagnosis not present

## 2018-01-28 DIAGNOSIS — M5386 Other specified dorsopathies, lumbar region: Secondary | ICD-10-CM | POA: Diagnosis not present

## 2018-01-28 DIAGNOSIS — M9904 Segmental and somatic dysfunction of sacral region: Secondary | ICD-10-CM | POA: Diagnosis not present

## 2018-01-28 DIAGNOSIS — M9902 Segmental and somatic dysfunction of thoracic region: Secondary | ICD-10-CM | POA: Diagnosis not present

## 2018-01-28 DIAGNOSIS — M9903 Segmental and somatic dysfunction of lumbar region: Secondary | ICD-10-CM | POA: Diagnosis not present

## 2018-02-04 DIAGNOSIS — M9903 Segmental and somatic dysfunction of lumbar region: Secondary | ICD-10-CM | POA: Diagnosis not present

## 2018-02-04 DIAGNOSIS — M5386 Other specified dorsopathies, lumbar region: Secondary | ICD-10-CM | POA: Diagnosis not present

## 2018-02-04 DIAGNOSIS — M9904 Segmental and somatic dysfunction of sacral region: Secondary | ICD-10-CM | POA: Diagnosis not present

## 2018-02-04 DIAGNOSIS — M9902 Segmental and somatic dysfunction of thoracic region: Secondary | ICD-10-CM | POA: Diagnosis not present

## 2018-02-13 DIAGNOSIS — R400 Somnolence: Secondary | ICD-10-CM | POA: Diagnosis not present

## 2018-02-13 DIAGNOSIS — G478 Other sleep disorders: Secondary | ICD-10-CM | POA: Diagnosis not present

## 2018-02-13 DIAGNOSIS — R0683 Snoring: Secondary | ICD-10-CM | POA: Diagnosis not present

## 2018-02-14 DIAGNOSIS — M9902 Segmental and somatic dysfunction of thoracic region: Secondary | ICD-10-CM | POA: Diagnosis not present

## 2018-02-14 DIAGNOSIS — M9904 Segmental and somatic dysfunction of sacral region: Secondary | ICD-10-CM | POA: Diagnosis not present

## 2018-02-14 DIAGNOSIS — M9903 Segmental and somatic dysfunction of lumbar region: Secondary | ICD-10-CM | POA: Diagnosis not present

## 2018-02-14 DIAGNOSIS — M5386 Other specified dorsopathies, lumbar region: Secondary | ICD-10-CM | POA: Diagnosis not present

## 2018-02-17 DIAGNOSIS — M9904 Segmental and somatic dysfunction of sacral region: Secondary | ICD-10-CM | POA: Diagnosis not present

## 2018-02-17 DIAGNOSIS — M9903 Segmental and somatic dysfunction of lumbar region: Secondary | ICD-10-CM | POA: Diagnosis not present

## 2018-02-17 DIAGNOSIS — M9902 Segmental and somatic dysfunction of thoracic region: Secondary | ICD-10-CM | POA: Diagnosis not present

## 2018-02-17 DIAGNOSIS — M5386 Other specified dorsopathies, lumbar region: Secondary | ICD-10-CM | POA: Diagnosis not present

## 2018-02-25 DIAGNOSIS — M9902 Segmental and somatic dysfunction of thoracic region: Secondary | ICD-10-CM | POA: Diagnosis not present

## 2018-02-25 DIAGNOSIS — M5386 Other specified dorsopathies, lumbar region: Secondary | ICD-10-CM | POA: Diagnosis not present

## 2018-02-25 DIAGNOSIS — M9904 Segmental and somatic dysfunction of sacral region: Secondary | ICD-10-CM | POA: Diagnosis not present

## 2018-02-25 DIAGNOSIS — M9903 Segmental and somatic dysfunction of lumbar region: Secondary | ICD-10-CM | POA: Diagnosis not present

## 2018-03-04 DIAGNOSIS — M9904 Segmental and somatic dysfunction of sacral region: Secondary | ICD-10-CM | POA: Diagnosis not present

## 2018-03-04 DIAGNOSIS — M5386 Other specified dorsopathies, lumbar region: Secondary | ICD-10-CM | POA: Diagnosis not present

## 2018-03-04 DIAGNOSIS — M9903 Segmental and somatic dysfunction of lumbar region: Secondary | ICD-10-CM | POA: Diagnosis not present

## 2018-03-04 DIAGNOSIS — M9902 Segmental and somatic dysfunction of thoracic region: Secondary | ICD-10-CM | POA: Diagnosis not present

## 2018-03-10 DIAGNOSIS — M5386 Other specified dorsopathies, lumbar region: Secondary | ICD-10-CM | POA: Diagnosis not present

## 2018-03-10 DIAGNOSIS — M9903 Segmental and somatic dysfunction of lumbar region: Secondary | ICD-10-CM | POA: Diagnosis not present

## 2018-03-10 DIAGNOSIS — M9902 Segmental and somatic dysfunction of thoracic region: Secondary | ICD-10-CM | POA: Diagnosis not present

## 2018-03-10 DIAGNOSIS — M9904 Segmental and somatic dysfunction of sacral region: Secondary | ICD-10-CM | POA: Diagnosis not present

## 2018-03-24 DIAGNOSIS — M9902 Segmental and somatic dysfunction of thoracic region: Secondary | ICD-10-CM | POA: Diagnosis not present

## 2018-03-24 DIAGNOSIS — M5386 Other specified dorsopathies, lumbar region: Secondary | ICD-10-CM | POA: Diagnosis not present

## 2018-03-24 DIAGNOSIS — M9904 Segmental and somatic dysfunction of sacral region: Secondary | ICD-10-CM | POA: Diagnosis not present

## 2018-03-24 DIAGNOSIS — M9903 Segmental and somatic dysfunction of lumbar region: Secondary | ICD-10-CM | POA: Diagnosis not present

## 2018-03-30 DIAGNOSIS — M9903 Segmental and somatic dysfunction of lumbar region: Secondary | ICD-10-CM | POA: Diagnosis not present

## 2018-03-30 DIAGNOSIS — M5386 Other specified dorsopathies, lumbar region: Secondary | ICD-10-CM | POA: Diagnosis not present

## 2018-03-30 DIAGNOSIS — M9902 Segmental and somatic dysfunction of thoracic region: Secondary | ICD-10-CM | POA: Diagnosis not present

## 2018-03-30 DIAGNOSIS — M9904 Segmental and somatic dysfunction of sacral region: Secondary | ICD-10-CM | POA: Diagnosis not present

## 2018-04-19 DIAGNOSIS — M5386 Other specified dorsopathies, lumbar region: Secondary | ICD-10-CM | POA: Diagnosis not present

## 2018-04-19 DIAGNOSIS — M9902 Segmental and somatic dysfunction of thoracic region: Secondary | ICD-10-CM | POA: Diagnosis not present

## 2018-04-19 DIAGNOSIS — M9903 Segmental and somatic dysfunction of lumbar region: Secondary | ICD-10-CM | POA: Diagnosis not present

## 2018-04-19 DIAGNOSIS — M9904 Segmental and somatic dysfunction of sacral region: Secondary | ICD-10-CM | POA: Diagnosis not present

## 2018-04-25 DIAGNOSIS — M5386 Other specified dorsopathies, lumbar region: Secondary | ICD-10-CM | POA: Diagnosis not present

## 2018-04-25 DIAGNOSIS — M9902 Segmental and somatic dysfunction of thoracic region: Secondary | ICD-10-CM | POA: Diagnosis not present

## 2018-04-25 DIAGNOSIS — M9904 Segmental and somatic dysfunction of sacral region: Secondary | ICD-10-CM | POA: Diagnosis not present

## 2018-04-25 DIAGNOSIS — M9903 Segmental and somatic dysfunction of lumbar region: Secondary | ICD-10-CM | POA: Diagnosis not present

## 2018-05-03 DIAGNOSIS — M9903 Segmental and somatic dysfunction of lumbar region: Secondary | ICD-10-CM | POA: Diagnosis not present

## 2018-05-03 DIAGNOSIS — M9902 Segmental and somatic dysfunction of thoracic region: Secondary | ICD-10-CM | POA: Diagnosis not present

## 2018-05-03 DIAGNOSIS — M9904 Segmental and somatic dysfunction of sacral region: Secondary | ICD-10-CM | POA: Diagnosis not present

## 2018-05-03 DIAGNOSIS — M5386 Other specified dorsopathies, lumbar region: Secondary | ICD-10-CM | POA: Diagnosis not present

## 2018-05-10 DIAGNOSIS — M9903 Segmental and somatic dysfunction of lumbar region: Secondary | ICD-10-CM | POA: Diagnosis not present

## 2018-05-10 DIAGNOSIS — M9904 Segmental and somatic dysfunction of sacral region: Secondary | ICD-10-CM | POA: Diagnosis not present

## 2018-05-10 DIAGNOSIS — M5386 Other specified dorsopathies, lumbar region: Secondary | ICD-10-CM | POA: Diagnosis not present

## 2018-05-10 DIAGNOSIS — M9902 Segmental and somatic dysfunction of thoracic region: Secondary | ICD-10-CM | POA: Diagnosis not present

## 2018-05-16 DIAGNOSIS — M5386 Other specified dorsopathies, lumbar region: Secondary | ICD-10-CM | POA: Diagnosis not present

## 2018-05-16 DIAGNOSIS — M9904 Segmental and somatic dysfunction of sacral region: Secondary | ICD-10-CM | POA: Diagnosis not present

## 2018-05-16 DIAGNOSIS — M9903 Segmental and somatic dysfunction of lumbar region: Secondary | ICD-10-CM | POA: Diagnosis not present

## 2018-05-16 DIAGNOSIS — M9902 Segmental and somatic dysfunction of thoracic region: Secondary | ICD-10-CM | POA: Diagnosis not present

## 2018-05-20 DIAGNOSIS — M5386 Other specified dorsopathies, lumbar region: Secondary | ICD-10-CM | POA: Diagnosis not present

## 2018-05-20 DIAGNOSIS — M9903 Segmental and somatic dysfunction of lumbar region: Secondary | ICD-10-CM | POA: Diagnosis not present

## 2018-05-20 DIAGNOSIS — M9904 Segmental and somatic dysfunction of sacral region: Secondary | ICD-10-CM | POA: Diagnosis not present

## 2018-05-20 DIAGNOSIS — M9902 Segmental and somatic dysfunction of thoracic region: Secondary | ICD-10-CM | POA: Diagnosis not present

## 2018-05-24 DIAGNOSIS — M5386 Other specified dorsopathies, lumbar region: Secondary | ICD-10-CM | POA: Diagnosis not present

## 2018-05-24 DIAGNOSIS — M9903 Segmental and somatic dysfunction of lumbar region: Secondary | ICD-10-CM | POA: Diagnosis not present

## 2018-05-24 DIAGNOSIS — M9904 Segmental and somatic dysfunction of sacral region: Secondary | ICD-10-CM | POA: Diagnosis not present

## 2018-05-24 DIAGNOSIS — M9902 Segmental and somatic dysfunction of thoracic region: Secondary | ICD-10-CM | POA: Diagnosis not present

## 2018-05-31 DIAGNOSIS — M9903 Segmental and somatic dysfunction of lumbar region: Secondary | ICD-10-CM | POA: Diagnosis not present

## 2018-05-31 DIAGNOSIS — M9904 Segmental and somatic dysfunction of sacral region: Secondary | ICD-10-CM | POA: Diagnosis not present

## 2018-05-31 DIAGNOSIS — M5386 Other specified dorsopathies, lumbar region: Secondary | ICD-10-CM | POA: Diagnosis not present

## 2018-05-31 DIAGNOSIS — M9902 Segmental and somatic dysfunction of thoracic region: Secondary | ICD-10-CM | POA: Diagnosis not present

## 2018-06-03 DIAGNOSIS — M9903 Segmental and somatic dysfunction of lumbar region: Secondary | ICD-10-CM | POA: Diagnosis not present

## 2018-06-03 DIAGNOSIS — M9904 Segmental and somatic dysfunction of sacral region: Secondary | ICD-10-CM | POA: Diagnosis not present

## 2018-06-03 DIAGNOSIS — M5386 Other specified dorsopathies, lumbar region: Secondary | ICD-10-CM | POA: Diagnosis not present

## 2018-06-03 DIAGNOSIS — M9902 Segmental and somatic dysfunction of thoracic region: Secondary | ICD-10-CM | POA: Diagnosis not present

## 2018-06-07 DIAGNOSIS — M9902 Segmental and somatic dysfunction of thoracic region: Secondary | ICD-10-CM | POA: Diagnosis not present

## 2018-06-07 DIAGNOSIS — M9904 Segmental and somatic dysfunction of sacral region: Secondary | ICD-10-CM | POA: Diagnosis not present

## 2018-06-07 DIAGNOSIS — M5386 Other specified dorsopathies, lumbar region: Secondary | ICD-10-CM | POA: Diagnosis not present

## 2018-06-07 DIAGNOSIS — M9903 Segmental and somatic dysfunction of lumbar region: Secondary | ICD-10-CM | POA: Diagnosis not present

## 2018-06-08 DIAGNOSIS — E7800 Pure hypercholesterolemia, unspecified: Secondary | ICD-10-CM | POA: Diagnosis not present

## 2018-06-08 DIAGNOSIS — E291 Testicular hypofunction: Secondary | ICD-10-CM | POA: Diagnosis not present

## 2018-06-08 DIAGNOSIS — N529 Male erectile dysfunction, unspecified: Secondary | ICD-10-CM | POA: Diagnosis not present

## 2018-06-08 DIAGNOSIS — E119 Type 2 diabetes mellitus without complications: Secondary | ICD-10-CM | POA: Diagnosis not present

## 2018-06-10 DIAGNOSIS — M9903 Segmental and somatic dysfunction of lumbar region: Secondary | ICD-10-CM | POA: Diagnosis not present

## 2018-06-10 DIAGNOSIS — M9902 Segmental and somatic dysfunction of thoracic region: Secondary | ICD-10-CM | POA: Diagnosis not present

## 2018-06-10 DIAGNOSIS — M9904 Segmental and somatic dysfunction of sacral region: Secondary | ICD-10-CM | POA: Diagnosis not present

## 2018-06-10 DIAGNOSIS — M5386 Other specified dorsopathies, lumbar region: Secondary | ICD-10-CM | POA: Diagnosis not present

## 2018-06-14 DIAGNOSIS — M9902 Segmental and somatic dysfunction of thoracic region: Secondary | ICD-10-CM | POA: Diagnosis not present

## 2018-06-14 DIAGNOSIS — M9904 Segmental and somatic dysfunction of sacral region: Secondary | ICD-10-CM | POA: Diagnosis not present

## 2018-06-14 DIAGNOSIS — M9903 Segmental and somatic dysfunction of lumbar region: Secondary | ICD-10-CM | POA: Diagnosis not present

## 2018-06-14 DIAGNOSIS — M5386 Other specified dorsopathies, lumbar region: Secondary | ICD-10-CM | POA: Diagnosis not present

## 2018-06-17 DIAGNOSIS — M9904 Segmental and somatic dysfunction of sacral region: Secondary | ICD-10-CM | POA: Diagnosis not present

## 2018-06-17 DIAGNOSIS — M5386 Other specified dorsopathies, lumbar region: Secondary | ICD-10-CM | POA: Diagnosis not present

## 2018-06-17 DIAGNOSIS — M9903 Segmental and somatic dysfunction of lumbar region: Secondary | ICD-10-CM | POA: Diagnosis not present

## 2018-06-17 DIAGNOSIS — M9902 Segmental and somatic dysfunction of thoracic region: Secondary | ICD-10-CM | POA: Diagnosis not present

## 2018-06-23 DIAGNOSIS — M9902 Segmental and somatic dysfunction of thoracic region: Secondary | ICD-10-CM | POA: Diagnosis not present

## 2018-06-23 DIAGNOSIS — M5386 Other specified dorsopathies, lumbar region: Secondary | ICD-10-CM | POA: Diagnosis not present

## 2018-06-23 DIAGNOSIS — M9904 Segmental and somatic dysfunction of sacral region: Secondary | ICD-10-CM | POA: Diagnosis not present

## 2018-06-23 DIAGNOSIS — M9903 Segmental and somatic dysfunction of lumbar region: Secondary | ICD-10-CM | POA: Diagnosis not present

## 2018-06-30 DIAGNOSIS — M9903 Segmental and somatic dysfunction of lumbar region: Secondary | ICD-10-CM | POA: Diagnosis not present

## 2018-06-30 DIAGNOSIS — M9902 Segmental and somatic dysfunction of thoracic region: Secondary | ICD-10-CM | POA: Diagnosis not present

## 2018-06-30 DIAGNOSIS — M5386 Other specified dorsopathies, lumbar region: Secondary | ICD-10-CM | POA: Diagnosis not present

## 2018-06-30 DIAGNOSIS — M9904 Segmental and somatic dysfunction of sacral region: Secondary | ICD-10-CM | POA: Diagnosis not present

## 2018-07-06 DIAGNOSIS — M9904 Segmental and somatic dysfunction of sacral region: Secondary | ICD-10-CM | POA: Diagnosis not present

## 2018-07-06 DIAGNOSIS — M5386 Other specified dorsopathies, lumbar region: Secondary | ICD-10-CM | POA: Diagnosis not present

## 2018-07-06 DIAGNOSIS — M9902 Segmental and somatic dysfunction of thoracic region: Secondary | ICD-10-CM | POA: Diagnosis not present

## 2018-07-06 DIAGNOSIS — M9903 Segmental and somatic dysfunction of lumbar region: Secondary | ICD-10-CM | POA: Diagnosis not present

## 2018-07-14 DIAGNOSIS — M9902 Segmental and somatic dysfunction of thoracic region: Secondary | ICD-10-CM | POA: Diagnosis not present

## 2018-07-14 DIAGNOSIS — M9903 Segmental and somatic dysfunction of lumbar region: Secondary | ICD-10-CM | POA: Diagnosis not present

## 2018-07-14 DIAGNOSIS — M9904 Segmental and somatic dysfunction of sacral region: Secondary | ICD-10-CM | POA: Diagnosis not present

## 2018-07-14 DIAGNOSIS — M5386 Other specified dorsopathies, lumbar region: Secondary | ICD-10-CM | POA: Diagnosis not present

## 2018-07-21 DIAGNOSIS — M9902 Segmental and somatic dysfunction of thoracic region: Secondary | ICD-10-CM | POA: Diagnosis not present

## 2018-07-21 DIAGNOSIS — M9903 Segmental and somatic dysfunction of lumbar region: Secondary | ICD-10-CM | POA: Diagnosis not present

## 2018-07-21 DIAGNOSIS — M5386 Other specified dorsopathies, lumbar region: Secondary | ICD-10-CM | POA: Diagnosis not present

## 2018-07-21 DIAGNOSIS — M9904 Segmental and somatic dysfunction of sacral region: Secondary | ICD-10-CM | POA: Diagnosis not present

## 2018-07-27 DIAGNOSIS — M9904 Segmental and somatic dysfunction of sacral region: Secondary | ICD-10-CM | POA: Diagnosis not present

## 2018-07-27 DIAGNOSIS — M9902 Segmental and somatic dysfunction of thoracic region: Secondary | ICD-10-CM | POA: Diagnosis not present

## 2018-07-27 DIAGNOSIS — M9903 Segmental and somatic dysfunction of lumbar region: Secondary | ICD-10-CM | POA: Diagnosis not present

## 2018-07-27 DIAGNOSIS — M5386 Other specified dorsopathies, lumbar region: Secondary | ICD-10-CM | POA: Diagnosis not present

## 2018-08-04 DIAGNOSIS — M9904 Segmental and somatic dysfunction of sacral region: Secondary | ICD-10-CM | POA: Diagnosis not present

## 2018-08-04 DIAGNOSIS — M9902 Segmental and somatic dysfunction of thoracic region: Secondary | ICD-10-CM | POA: Diagnosis not present

## 2018-08-04 DIAGNOSIS — M9903 Segmental and somatic dysfunction of lumbar region: Secondary | ICD-10-CM | POA: Diagnosis not present

## 2018-08-04 DIAGNOSIS — M5386 Other specified dorsopathies, lumbar region: Secondary | ICD-10-CM | POA: Diagnosis not present

## 2018-08-09 DIAGNOSIS — M9902 Segmental and somatic dysfunction of thoracic region: Secondary | ICD-10-CM | POA: Diagnosis not present

## 2018-08-09 DIAGNOSIS — M9903 Segmental and somatic dysfunction of lumbar region: Secondary | ICD-10-CM | POA: Diagnosis not present

## 2018-08-09 DIAGNOSIS — M5386 Other specified dorsopathies, lumbar region: Secondary | ICD-10-CM | POA: Diagnosis not present

## 2018-08-09 DIAGNOSIS — M9904 Segmental and somatic dysfunction of sacral region: Secondary | ICD-10-CM | POA: Diagnosis not present

## 2018-08-25 DIAGNOSIS — M9904 Segmental and somatic dysfunction of sacral region: Secondary | ICD-10-CM | POA: Diagnosis not present

## 2018-08-25 DIAGNOSIS — M9903 Segmental and somatic dysfunction of lumbar region: Secondary | ICD-10-CM | POA: Diagnosis not present

## 2018-08-25 DIAGNOSIS — M9902 Segmental and somatic dysfunction of thoracic region: Secondary | ICD-10-CM | POA: Diagnosis not present

## 2018-08-25 DIAGNOSIS — M5386 Other specified dorsopathies, lumbar region: Secondary | ICD-10-CM | POA: Diagnosis not present

## 2018-08-31 DIAGNOSIS — M9902 Segmental and somatic dysfunction of thoracic region: Secondary | ICD-10-CM | POA: Diagnosis not present

## 2018-08-31 DIAGNOSIS — M5386 Other specified dorsopathies, lumbar region: Secondary | ICD-10-CM | POA: Diagnosis not present

## 2018-08-31 DIAGNOSIS — M9904 Segmental and somatic dysfunction of sacral region: Secondary | ICD-10-CM | POA: Diagnosis not present

## 2018-08-31 DIAGNOSIS — M9903 Segmental and somatic dysfunction of lumbar region: Secondary | ICD-10-CM | POA: Diagnosis not present

## 2018-09-06 DIAGNOSIS — M5386 Other specified dorsopathies, lumbar region: Secondary | ICD-10-CM | POA: Diagnosis not present

## 2018-09-06 DIAGNOSIS — M9903 Segmental and somatic dysfunction of lumbar region: Secondary | ICD-10-CM | POA: Diagnosis not present

## 2018-09-06 DIAGNOSIS — M9902 Segmental and somatic dysfunction of thoracic region: Secondary | ICD-10-CM | POA: Diagnosis not present

## 2018-09-06 DIAGNOSIS — M9904 Segmental and somatic dysfunction of sacral region: Secondary | ICD-10-CM | POA: Diagnosis not present

## 2018-09-21 DIAGNOSIS — E7800 Pure hypercholesterolemia, unspecified: Secondary | ICD-10-CM | POA: Diagnosis not present

## 2018-09-21 DIAGNOSIS — Z23 Encounter for immunization: Secondary | ICD-10-CM | POA: Diagnosis not present

## 2018-09-21 DIAGNOSIS — Z6833 Body mass index (BMI) 33.0-33.9, adult: Secondary | ICD-10-CM | POA: Diagnosis not present

## 2018-09-21 DIAGNOSIS — E119 Type 2 diabetes mellitus without complications: Secondary | ICD-10-CM | POA: Diagnosis not present

## 2018-09-22 DIAGNOSIS — M546 Pain in thoracic spine: Secondary | ICD-10-CM | POA: Diagnosis not present

## 2018-09-22 DIAGNOSIS — E119 Type 2 diabetes mellitus without complications: Secondary | ICD-10-CM | POA: Diagnosis not present

## 2018-09-25 DIAGNOSIS — M25551 Pain in right hip: Secondary | ICD-10-CM | POA: Diagnosis not present

## 2018-09-25 DIAGNOSIS — M542 Cervicalgia: Secondary | ICD-10-CM | POA: Diagnosis not present

## 2018-09-28 DIAGNOSIS — M25551 Pain in right hip: Secondary | ICD-10-CM | POA: Diagnosis not present

## 2018-09-28 DIAGNOSIS — M542 Cervicalgia: Secondary | ICD-10-CM | POA: Diagnosis not present

## 2018-09-30 DIAGNOSIS — M9902 Segmental and somatic dysfunction of thoracic region: Secondary | ICD-10-CM | POA: Diagnosis not present

## 2018-09-30 DIAGNOSIS — M9904 Segmental and somatic dysfunction of sacral region: Secondary | ICD-10-CM | POA: Diagnosis not present

## 2018-09-30 DIAGNOSIS — M5386 Other specified dorsopathies, lumbar region: Secondary | ICD-10-CM | POA: Diagnosis not present

## 2018-09-30 DIAGNOSIS — M9903 Segmental and somatic dysfunction of lumbar region: Secondary | ICD-10-CM | POA: Diagnosis not present

## 2018-10-03 DIAGNOSIS — M542 Cervicalgia: Secondary | ICD-10-CM | POA: Diagnosis not present

## 2018-10-03 DIAGNOSIS — M25551 Pain in right hip: Secondary | ICD-10-CM | POA: Diagnosis not present

## 2018-10-06 DIAGNOSIS — M25551 Pain in right hip: Secondary | ICD-10-CM | POA: Diagnosis not present

## 2018-10-06 DIAGNOSIS — M542 Cervicalgia: Secondary | ICD-10-CM | POA: Diagnosis not present

## 2018-10-12 DIAGNOSIS — M542 Cervicalgia: Secondary | ICD-10-CM | POA: Diagnosis not present

## 2018-10-12 DIAGNOSIS — M25551 Pain in right hip: Secondary | ICD-10-CM | POA: Diagnosis not present

## 2018-11-08 DIAGNOSIS — M5386 Other specified dorsopathies, lumbar region: Secondary | ICD-10-CM | POA: Diagnosis not present

## 2018-11-08 DIAGNOSIS — M9903 Segmental and somatic dysfunction of lumbar region: Secondary | ICD-10-CM | POA: Diagnosis not present

## 2018-11-08 DIAGNOSIS — M9904 Segmental and somatic dysfunction of sacral region: Secondary | ICD-10-CM | POA: Diagnosis not present

## 2018-11-08 DIAGNOSIS — M9902 Segmental and somatic dysfunction of thoracic region: Secondary | ICD-10-CM | POA: Diagnosis not present

## 2018-11-18 DIAGNOSIS — M9904 Segmental and somatic dysfunction of sacral region: Secondary | ICD-10-CM | POA: Diagnosis not present

## 2018-11-18 DIAGNOSIS — M9903 Segmental and somatic dysfunction of lumbar region: Secondary | ICD-10-CM | POA: Diagnosis not present

## 2018-11-18 DIAGNOSIS — M9902 Segmental and somatic dysfunction of thoracic region: Secondary | ICD-10-CM | POA: Diagnosis not present

## 2018-11-18 DIAGNOSIS — M5386 Other specified dorsopathies, lumbar region: Secondary | ICD-10-CM | POA: Diagnosis not present

## 2018-12-16 DIAGNOSIS — M9902 Segmental and somatic dysfunction of thoracic region: Secondary | ICD-10-CM | POA: Diagnosis not present

## 2018-12-16 DIAGNOSIS — M9904 Segmental and somatic dysfunction of sacral region: Secondary | ICD-10-CM | POA: Diagnosis not present

## 2018-12-16 DIAGNOSIS — M5386 Other specified dorsopathies, lumbar region: Secondary | ICD-10-CM | POA: Diagnosis not present

## 2018-12-16 DIAGNOSIS — M9903 Segmental and somatic dysfunction of lumbar region: Secondary | ICD-10-CM | POA: Diagnosis not present

## 2018-12-24 DIAGNOSIS — Z20828 Contact with and (suspected) exposure to other viral communicable diseases: Secondary | ICD-10-CM | POA: Diagnosis not present

## 2019-01-10 DIAGNOSIS — M5386 Other specified dorsopathies, lumbar region: Secondary | ICD-10-CM | POA: Diagnosis not present

## 2019-01-10 DIAGNOSIS — M9902 Segmental and somatic dysfunction of thoracic region: Secondary | ICD-10-CM | POA: Diagnosis not present

## 2019-01-10 DIAGNOSIS — M9904 Segmental and somatic dysfunction of sacral region: Secondary | ICD-10-CM | POA: Diagnosis not present

## 2019-01-10 DIAGNOSIS — M9903 Segmental and somatic dysfunction of lumbar region: Secondary | ICD-10-CM | POA: Diagnosis not present

## 2019-01-17 DIAGNOSIS — M9903 Segmental and somatic dysfunction of lumbar region: Secondary | ICD-10-CM | POA: Diagnosis not present

## 2019-01-17 DIAGNOSIS — M9904 Segmental and somatic dysfunction of sacral region: Secondary | ICD-10-CM | POA: Diagnosis not present

## 2019-01-17 DIAGNOSIS — M5386 Other specified dorsopathies, lumbar region: Secondary | ICD-10-CM | POA: Diagnosis not present

## 2019-01-17 DIAGNOSIS — M9902 Segmental and somatic dysfunction of thoracic region: Secondary | ICD-10-CM | POA: Diagnosis not present

## 2019-01-31 DIAGNOSIS — E119 Type 2 diabetes mellitus without complications: Secondary | ICD-10-CM | POA: Diagnosis not present

## 2019-01-31 DIAGNOSIS — E7800 Pure hypercholesterolemia, unspecified: Secondary | ICD-10-CM | POA: Diagnosis not present

## 2019-01-31 DIAGNOSIS — E291 Testicular hypofunction: Secondary | ICD-10-CM | POA: Diagnosis not present

## 2019-02-06 DIAGNOSIS — Z20828 Contact with and (suspected) exposure to other viral communicable diseases: Secondary | ICD-10-CM | POA: Diagnosis not present

## 2019-03-09 DIAGNOSIS — E291 Testicular hypofunction: Secondary | ICD-10-CM | POA: Diagnosis not present

## 2019-03-25 DIAGNOSIS — K047 Periapical abscess without sinus: Secondary | ICD-10-CM | POA: Diagnosis not present

## 2019-06-16 DIAGNOSIS — M9903 Segmental and somatic dysfunction of lumbar region: Secondary | ICD-10-CM | POA: Diagnosis not present

## 2019-06-16 DIAGNOSIS — M5386 Other specified dorsopathies, lumbar region: Secondary | ICD-10-CM | POA: Diagnosis not present

## 2019-06-16 DIAGNOSIS — M9902 Segmental and somatic dysfunction of thoracic region: Secondary | ICD-10-CM | POA: Diagnosis not present

## 2019-06-16 DIAGNOSIS — M9904 Segmental and somatic dysfunction of sacral region: Secondary | ICD-10-CM | POA: Diagnosis not present

## 2019-06-30 DIAGNOSIS — M9903 Segmental and somatic dysfunction of lumbar region: Secondary | ICD-10-CM | POA: Diagnosis not present

## 2019-06-30 DIAGNOSIS — M9904 Segmental and somatic dysfunction of sacral region: Secondary | ICD-10-CM | POA: Diagnosis not present

## 2019-06-30 DIAGNOSIS — M5386 Other specified dorsopathies, lumbar region: Secondary | ICD-10-CM | POA: Diagnosis not present

## 2019-06-30 DIAGNOSIS — M9902 Segmental and somatic dysfunction of thoracic region: Secondary | ICD-10-CM | POA: Diagnosis not present

## 2019-07-14 DIAGNOSIS — M9904 Segmental and somatic dysfunction of sacral region: Secondary | ICD-10-CM | POA: Diagnosis not present

## 2019-07-14 DIAGNOSIS — M9902 Segmental and somatic dysfunction of thoracic region: Secondary | ICD-10-CM | POA: Diagnosis not present

## 2019-07-14 DIAGNOSIS — M9903 Segmental and somatic dysfunction of lumbar region: Secondary | ICD-10-CM | POA: Diagnosis not present

## 2019-07-14 DIAGNOSIS — M5386 Other specified dorsopathies, lumbar region: Secondary | ICD-10-CM | POA: Diagnosis not present

## 2019-07-16 DIAGNOSIS — J019 Acute sinusitis, unspecified: Secondary | ICD-10-CM | POA: Diagnosis not present

## 2019-07-28 DIAGNOSIS — M9904 Segmental and somatic dysfunction of sacral region: Secondary | ICD-10-CM | POA: Diagnosis not present

## 2019-07-28 DIAGNOSIS — M9903 Segmental and somatic dysfunction of lumbar region: Secondary | ICD-10-CM | POA: Diagnosis not present

## 2019-07-28 DIAGNOSIS — M5386 Other specified dorsopathies, lumbar region: Secondary | ICD-10-CM | POA: Diagnosis not present

## 2019-07-28 DIAGNOSIS — M9902 Segmental and somatic dysfunction of thoracic region: Secondary | ICD-10-CM | POA: Diagnosis not present

## 2019-08-02 DIAGNOSIS — E119 Type 2 diabetes mellitus without complications: Secondary | ICD-10-CM | POA: Diagnosis not present

## 2019-08-02 DIAGNOSIS — E7800 Pure hypercholesterolemia, unspecified: Secondary | ICD-10-CM | POA: Diagnosis not present

## 2019-08-02 DIAGNOSIS — Z0000 Encounter for general adult medical examination without abnormal findings: Secondary | ICD-10-CM | POA: Diagnosis not present

## 2019-08-02 DIAGNOSIS — Z125 Encounter for screening for malignant neoplasm of prostate: Secondary | ICD-10-CM | POA: Diagnosis not present

## 2019-08-04 DIAGNOSIS — M5386 Other specified dorsopathies, lumbar region: Secondary | ICD-10-CM | POA: Diagnosis not present

## 2019-08-04 DIAGNOSIS — M9903 Segmental and somatic dysfunction of lumbar region: Secondary | ICD-10-CM | POA: Diagnosis not present

## 2019-08-04 DIAGNOSIS — M9902 Segmental and somatic dysfunction of thoracic region: Secondary | ICD-10-CM | POA: Diagnosis not present

## 2019-08-04 DIAGNOSIS — M9904 Segmental and somatic dysfunction of sacral region: Secondary | ICD-10-CM | POA: Diagnosis not present

## 2019-08-11 DIAGNOSIS — M9903 Segmental and somatic dysfunction of lumbar region: Secondary | ICD-10-CM | POA: Diagnosis not present

## 2019-08-11 DIAGNOSIS — M9902 Segmental and somatic dysfunction of thoracic region: Secondary | ICD-10-CM | POA: Diagnosis not present

## 2019-08-11 DIAGNOSIS — M9904 Segmental and somatic dysfunction of sacral region: Secondary | ICD-10-CM | POA: Diagnosis not present

## 2019-08-11 DIAGNOSIS — M5386 Other specified dorsopathies, lumbar region: Secondary | ICD-10-CM | POA: Diagnosis not present

## 2019-08-17 DIAGNOSIS — M545 Low back pain: Secondary | ICD-10-CM | POA: Diagnosis not present

## 2019-08-17 DIAGNOSIS — M25551 Pain in right hip: Secondary | ICD-10-CM | POA: Diagnosis not present

## 2019-08-18 DIAGNOSIS — M9902 Segmental and somatic dysfunction of thoracic region: Secondary | ICD-10-CM | POA: Diagnosis not present

## 2019-08-18 DIAGNOSIS — M5386 Other specified dorsopathies, lumbar region: Secondary | ICD-10-CM | POA: Diagnosis not present

## 2019-08-18 DIAGNOSIS — M9903 Segmental and somatic dysfunction of lumbar region: Secondary | ICD-10-CM | POA: Diagnosis not present

## 2019-08-18 DIAGNOSIS — M9904 Segmental and somatic dysfunction of sacral region: Secondary | ICD-10-CM | POA: Diagnosis not present

## 2019-08-29 DIAGNOSIS — G4733 Obstructive sleep apnea (adult) (pediatric): Secondary | ICD-10-CM | POA: Diagnosis not present

## 2019-08-31 DIAGNOSIS — M545 Low back pain: Secondary | ICD-10-CM | POA: Diagnosis not present

## 2019-08-31 DIAGNOSIS — M25551 Pain in right hip: Secondary | ICD-10-CM | POA: Diagnosis not present

## 2019-09-04 DIAGNOSIS — Z20822 Contact with and (suspected) exposure to covid-19: Secondary | ICD-10-CM | POA: Diagnosis not present

## 2019-09-04 DIAGNOSIS — Z03818 Encounter for observation for suspected exposure to other biological agents ruled out: Secondary | ICD-10-CM | POA: Diagnosis not present

## 2019-09-25 DIAGNOSIS — M9904 Segmental and somatic dysfunction of sacral region: Secondary | ICD-10-CM | POA: Diagnosis not present

## 2019-09-25 DIAGNOSIS — M5386 Other specified dorsopathies, lumbar region: Secondary | ICD-10-CM | POA: Diagnosis not present

## 2019-09-25 DIAGNOSIS — M9902 Segmental and somatic dysfunction of thoracic region: Secondary | ICD-10-CM | POA: Diagnosis not present

## 2019-09-25 DIAGNOSIS — M9903 Segmental and somatic dysfunction of lumbar region: Secondary | ICD-10-CM | POA: Diagnosis not present

## 2019-11-14 ENCOUNTER — Other Ambulatory Visit: Payer: Self-pay

## 2019-11-14 ENCOUNTER — Ambulatory Visit: Payer: Self-pay

## 2019-11-14 ENCOUNTER — Ambulatory Visit: Payer: 59 | Admitting: Orthopaedic Surgery

## 2019-11-14 VITALS — Ht 69.0 in | Wt 225.0 lb

## 2019-11-14 DIAGNOSIS — M25551 Pain in right hip: Secondary | ICD-10-CM

## 2019-11-14 DIAGNOSIS — M4807 Spinal stenosis, lumbosacral region: Secondary | ICD-10-CM

## 2019-11-14 NOTE — Progress Notes (Signed)
Office Visit Note   Patient: Bryan Bradley           Date of Birth: September 03, 1960           MRN: [id number] Visit Date: 11/14/2019              Requested by: Blair Heys, MD [address] [place],  [place] [address] PCP: Blair Heys, MD   Assessment & Plan: Visit Diagnoses:  1. Pain in right hip     Plan: Given the fact that he has failed conservative treatment including therapy for over a year now, a MRI of the lumbar spine is warranted to rule out nerve compression to the right side.  He agrees with this treatment plan.  All question concerns were answered addressed.  We will see him back after we get an MRI of the lumbar spine.  Follow-Up Instructions: Return in about 2 weeks (around 11/28/2019).   Orders:  Orders Placed This Encounter  Procedures  . XR HIP UNILAT W OR W/O PELVIS 1V RIGHT   No orders of the defined types were placed in this encounter.     Procedures: No procedures performed   Clinical Data: No additional findings.   Subjective: Chief Complaint  Patient presents with  . Right Hip - Pain  Gautam is a very active individual who actually know his family members were well.  He is 59 years old and reports worsening right hip and low back pain for over a year now.  He has tried physical therapy and Pilates he works with a Systems analyst as well.  He is now started developing radicular symptoms as well.  His left side is somewhat bothering him from overcompensating.  He does have pain that wakes him up at night.  Pivoting getting in and out of the car causes significant pain in his lower back and to the sciatic region on his right side.  He is not a diabetic.  He has never had surgery on his back or his hip before.  He denies any acute change in medical status.  HPI  Review of Systems There is currently listed no headache, chest pain, shortness of breath, fever, chills, nausea, vomiting  Objective: Vital Signs: Ht 5\' 9"  (1.753 m)    Wt 225 lb (102.1 kg)   BMI 33.23 kg/m   Physical Exam He is alert and orient x3 and in no acute distress Ortho Exam Examination of his right hip is entirely normal in terms of full range of motion and no pain in the groin at all.  There is no blocks to motion.  There is no pain with compressing the hip.  He has 5 out of 5 strength with his quads and his hip flexors.  There is a small palpable mass in the subcutaneous tissue near his anterior superior leg spine and the proximal trochanteric area.  This may be more of a lipoma.  Is asymptomatic.  There is no skin redness.  He does have pain in the lower lumbar spine to the right side and a positive straight leg raise.  He has significant pain with flexion extension of the lumbar spine to the right side. Specialty Comments:  No specialty comments available.  Imaging: No results found.   PMFS History: Patient Active Problem List   Diagnosis Date Noted  . Plantar fasciitis, right 11/06/2013  . Inj muscle, fascia and tendon of prt biceps, left arm, init 11/06/2013  . Nodule of flexor tendon  sheath Right 3rd Digit 11/06/2013   Past Medical History:  Diagnosis Date  . GERD (gastroesophageal reflux disease)   . Hyperlipidemia     Family History  Problem Relation Age of Onset  . Lung cancer Father   . Colon cancer Neg Hx   . Stomach cancer Neg Hx     Past Surgical History:  Procedure Laterality Date  . CYST REMOVAL NECK  1969  . ROTATOR CUFF REPAIR  2005   right   Social History   Occupational History  . Not on file  Tobacco Use  . Smoking status: Never Smoker  . Smokeless tobacco: Current User    Types: Chew  Substance and Sexual Activity  . Alcohol use: Yes    Comment: rare  . Drug use: No  . Sexual activity: Not on file

## 2019-11-28 ENCOUNTER — Ambulatory Visit: Payer: BLUE CROSS/BLUE SHIELD | Admitting: Orthopaedic Surgery

## 2019-12-04 ENCOUNTER — Ambulatory Visit
Admission: RE | Admit: 2019-12-04 | Discharge: 2019-12-04 | Disposition: A | Discharge status: Home / Self Care | Payer: 59 | Source: Ambulatory Visit | Attending: Orthopaedic Surgery | Admitting: Orthopaedic Surgery

## 2019-12-04 DIAGNOSIS — M4807 Spinal stenosis, lumbosacral region: Secondary | ICD-10-CM

## 2019-12-06 ENCOUNTER — Encounter: Payer: Self-pay | Admitting: Orthopaedic Surgery

## 2019-12-06 ENCOUNTER — Ambulatory Visit (INDEPENDENT_AMBULATORY_CARE_PROVIDER_SITE_OTHER): Payer: 59 | Admitting: Orthopaedic Surgery

## 2019-12-06 ENCOUNTER — Other Ambulatory Visit: Payer: Self-pay

## 2019-12-06 DIAGNOSIS — M4807 Spinal stenosis, lumbosacral region: Secondary | ICD-10-CM

## 2019-12-06 DIAGNOSIS — M25551 Pain in right hip: Secondary | ICD-10-CM

## 2019-12-06 NOTE — Progress Notes (Signed)
Yves comes in today to go over a MRI of his lumbar spine.  He was having right-sided low back pain and sciatic symptoms that radiates down the leg.  He also has a little bit of trochanteric pain as well.  On his exam last time his right hip exam is otherwise normal in terms of fluid range of motion of the right hip and no pain in the groin.  On exam he still has pain to palpation over the tip of the greater trochanter.  He does have low back pain to the right side that radiates in the sciatic area and down the right side of his leg.  There is also some pain to palpation lower aspect of his lumbar spine to the right.  The MRI does show a central disc at L4-L5.  This is causing moderate bilateral recess stenosis.  There is also some facet disease at that level.  I would like to send him to Dr. Alvester Morin for an epidural steroid injection to believe the right-sided L4-L5.  At the same time he would like to have a steroid injection in his right hip trochanteric area.  He did not want me to do one today since he is traveling out of state tomorrow and did not want to have any issues.  We will work on getting him set up for an appoint with Dr. Alvester Morin and then Dr. Alvester Morin can get him back to me 2 to 3 weeks after his injections.  All questions and concerns were answered and addressed.

## 2019-12-11 ENCOUNTER — Other Ambulatory Visit: Payer: Self-pay

## 2019-12-11 DIAGNOSIS — M48061 Spinal stenosis, lumbar region without neurogenic claudication: Secondary | ICD-10-CM

## 2019-12-17 ENCOUNTER — Ambulatory Visit
Admission: RE | Admit: 2019-12-17 | Discharge: 2019-12-17 | Disposition: A | Discharge status: Home / Self Care | Payer: 59 | Source: Ambulatory Visit | Attending: Physical Medicine and Rehabilitation | Admitting: Physical Medicine and Rehabilitation

## 2019-12-17 DIAGNOSIS — M48061 Spinal stenosis, lumbar region without neurogenic claudication: Secondary | ICD-10-CM

## 2019-12-17 NOTE — Discharge Instructions (Signed)

## 2019-12-31 ENCOUNTER — Telehealth: Payer: Self-pay

## 2019-12-31 NOTE — Telephone Encounter (Signed)
Patient came in he stated he has a appointment for an injection for his back he stated he the appointment scheduled needs to be for the hip instead, he got his back done at [facility] imaging. CB:[phone]

## 2020-01-01 NOTE — Telephone Encounter (Signed)
Please Advise!  Pt has an appt on 01/10/20.  I have return pt call and yes, he has gotten his back done by [place] and would like for this appt for his hip inj.

## 2020-01-02 NOTE — Telephone Encounter (Signed)
Per referral from Dr. Magnus Ivan, patient was to have ESI and right greater trochanteric bursa injection. I have changed the appointment notes to be for bursa injection only and called the patient to advise.

## 2020-01-03 NOTE — Telephone Encounter (Signed)
Got it, thanks!

## 2020-01-10 ENCOUNTER — Other Ambulatory Visit: Payer: Self-pay

## 2020-01-10 ENCOUNTER — Ambulatory Visit: Payer: Self-pay

## 2020-01-10 ENCOUNTER — Encounter: Payer: Self-pay | Admitting: Physical Medicine and Rehabilitation

## 2020-01-10 ENCOUNTER — Ambulatory Visit (INDEPENDENT_AMBULATORY_CARE_PROVIDER_SITE_OTHER): Payer: 59 | Admitting: Physical Medicine and Rehabilitation

## 2020-01-10 DIAGNOSIS — M7061 Trochanteric bursitis, right hip: Secondary | ICD-10-CM

## 2020-01-10 NOTE — Progress Notes (Signed)
Pt state right hip pain the travel from the lower back. Pt state getting out of bed makes the pain worse. Pt state excise, massages and over the counter pain meds help ease the pain.    Numeric Pain Rating Scale and Functional Assessment Average Pain 6   In the last MONTH (on 0-10 scale) has pain interfered with the following?  1. General activity like being  able to carry out your everyday physical activities such as walking, climbing stairs, carrying groceries, or moving a chair?  Rating(10)

## 2020-01-28 ENCOUNTER — Ambulatory Visit: Payer: 59 | Admitting: Orthopaedic Surgery

## 2020-01-28 DIAGNOSIS — M25551 Pain in right hip: Secondary | ICD-10-CM | POA: Diagnosis not present

## 2020-01-28 NOTE — Progress Notes (Signed)
Bryan Bradley comes in today stating that his right hip is finally feeling better after a physical therapy session where he performed dry needling and he has an inversion table now.  An epidural steroid injection done by [facility] imaging in his lumbar spine as well as a trochanteric injection under fluoroscopy by Dr. Alvester Morin have not helped.  On exam he does move smoothly and fluidly with his right hip with pain of the proximal IT band and the trochanteric area.  Previous plain films of the right hip did not show any acute findings or significant joint space narrowing.  At this point he will continue the dry needling and his inversion table.  If this worsens at all he will call me because we would then set up a MRI of his right hip to assess the cartilage in the surrounding tendinous structures.  All questions and concerns were answered addressed.  Follow-up is otherwise as needed.

## 2020-02-20 MED ORDER — TRIAMCINOLONE ACETONIDE 40 MG/ML IJ SUSP
60.0000 mg | INTRAMUSCULAR | Status: AC | PRN
  Administered 2020-01-10: 60 mg via INTRA_ARTICULAR

## 2020-02-20 MED ORDER — LIDOCAINE HCL 2 % IJ SOLN
4.0000 mL | INTRAMUSCULAR | Status: AC | PRN
  Administered 2020-01-10: 4 mL

## 2020-02-20 MED ORDER — BUPIVACAINE HCL 0.25 % IJ SOLN
4.0000 mL | INTRAMUSCULAR | Status: AC | PRN
  Administered 2020-01-10: 4 mL via INTRA_ARTICULAR

## 2020-02-20 NOTE — Progress Notes (Signed)
   Bryan Bradley - 60 y.o. male MRN [id number]  Date of birth: [birth year]  Office Visit Note: Visit Date: 01/10/2020 PCP: Blair Heys, MD Referred by: Blair Heys, MD  Subjective: Chief Complaint  Patient presents with  . Right Hip - Pain  . Lower Back - Pain   HPI:  Bryan Bradley is a 60 y.o. male who comes in today for planned repeat Right greater trochanteric injection with fluoroscopic guidance.  The patient has failed conservative care including home exercise, medications, time and activity modification. Prior injection gave more than 50% relief for several months. This injection will be diagnostic and hopefully therapeutic.  Please see requesting physician notes for further details and justification.  Referring: Dr. Doneen Poisson   ROS Otherwise per HPI.  Assessment & Plan: Visit Diagnoses:    ICD-10-CM   1. Greater trochanteric bursitis, right  M70.61 XR C-ARM NO REPORT    Plan: No additional findings.   Meds & Orders: No orders of the defined types were placed in this encounter.   Orders Placed This Encounter  Procedures  . Large Joint Inj  . XR C-ARM NO REPORT    Follow-up: Return for visit to requesting physician as needed.   Procedures: Large Joint Inj: R greater trochanter on 01/10/2020 11:00 AM Indications: pain and diagnostic evaluation Details: 22 G 3.5 in needle, fluoroscopy-guided lateral approach  Arthrogram: No  Medications: 4 mL lidocaine 2 %; 4 mL bupivacaine 0.25 %; 60 mg triamcinolone acetonide 40 MG/ML Outcome: tolerated well, no immediate complications  There was excellent flow of contrast outlined the greater trochanteric bursa without vascular uptake. Procedure, treatment alternatives, risks and benefits explained, specific risks discussed. Consent was given by the patient. Immediately prior to procedure a time out was called to verify the correct patient, procedure, equipment, support staff and site/side marked as required.  Patient was prepped and draped in the usual sterile fashion.          Clinical History: No specialty comments available.     Objective:  VS:  HT:    WT:   BMI:     BP:   HR: bpm  TEMP: ( )  RESP:  Physical Exam Musculoskeletal:     Comments: Patient has no pain with hip rotation does have concordant pain over the right greater trochanter.      Imaging: No results found.

## 2021-02-06 IMAGING — MR MR LUMBAR SPINE W/O CM
4 of 5 series · 18 of 48 positions shown · non-contrast
Comparison: 12/08/2009

CLINICAL DATA: Right low back pain radiating to the right hip for 1
year.

EXAM:
MRI LUMBAR SPINE WITHOUT CONTRAST
TECHNIQUE: Multiplanar, multisequence MR imaging of the lumbar spine was
performed. No intravenous contrast was administered.

[Series 5: T2 · sagittal · 4.0mm · 0.73mm/px · 6 of 17 slices shown (1 of 2)]
[im 1/17]
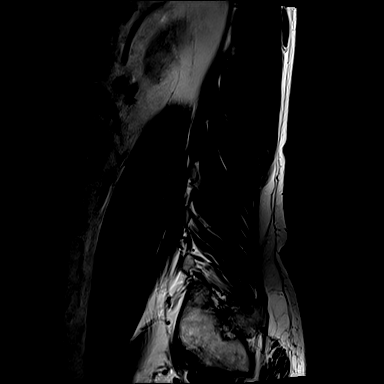
[im 4/17]
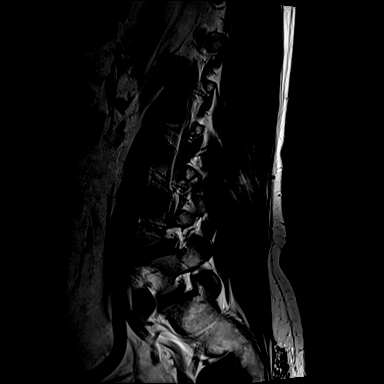
[im 7/17]
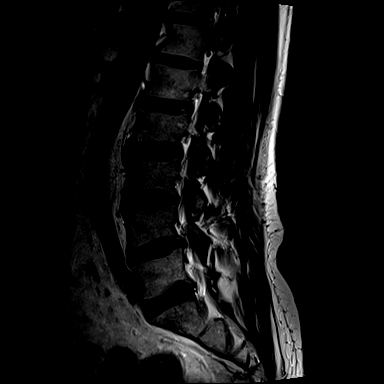
[im 10/17]
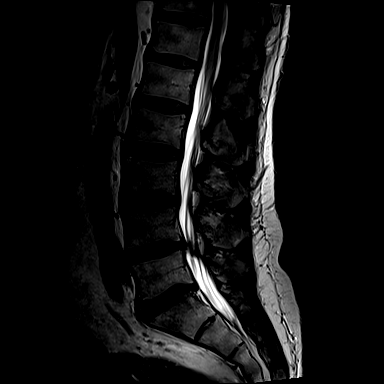
[im 13/17]
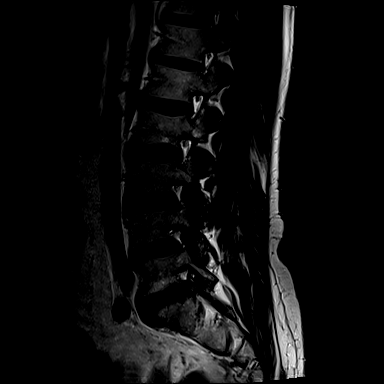
[im 17/17]
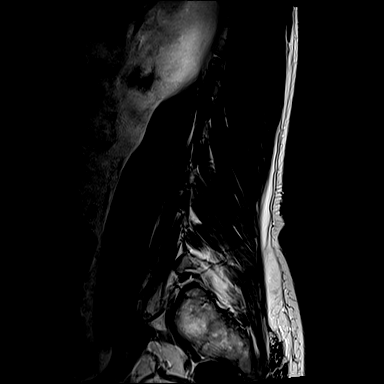

[Series 6: T1 · sagittal · 4.0mm · 0.73mm/px · 3 of 17 slices shown (1 of 2)]
[im 4/17]
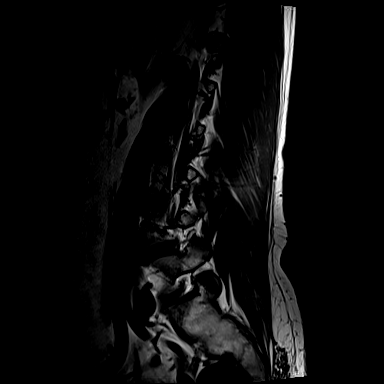
[im 10/17]
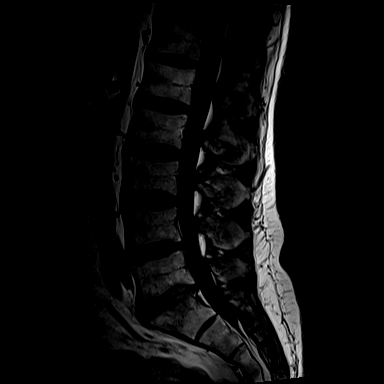
[im 17/17]
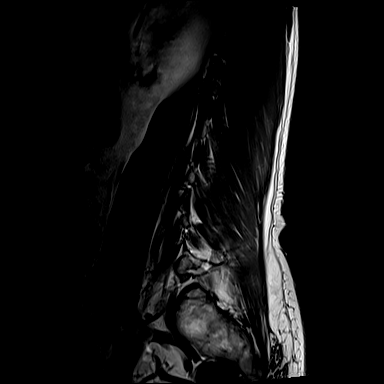

[Series 12: T2 · axial · 4.0mm · 0.28mm/px · z∈[-82,+107]mm · 6 of 41 slices shown (2 of 2)]
[im 1/41]
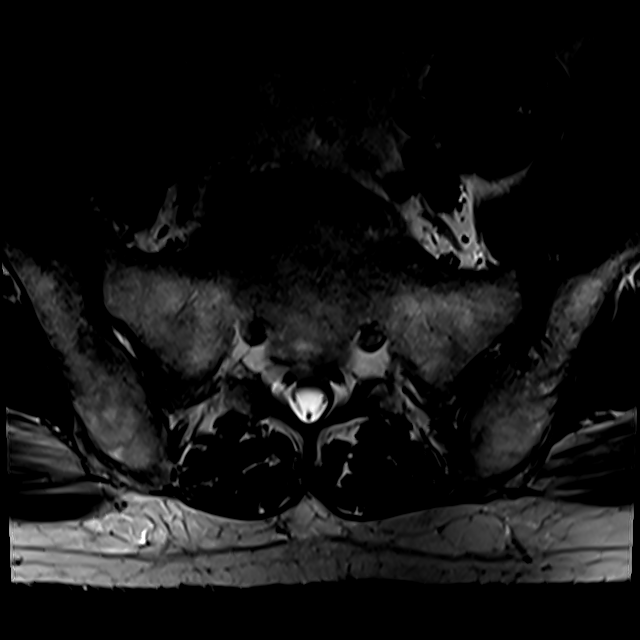
[im 6/41]
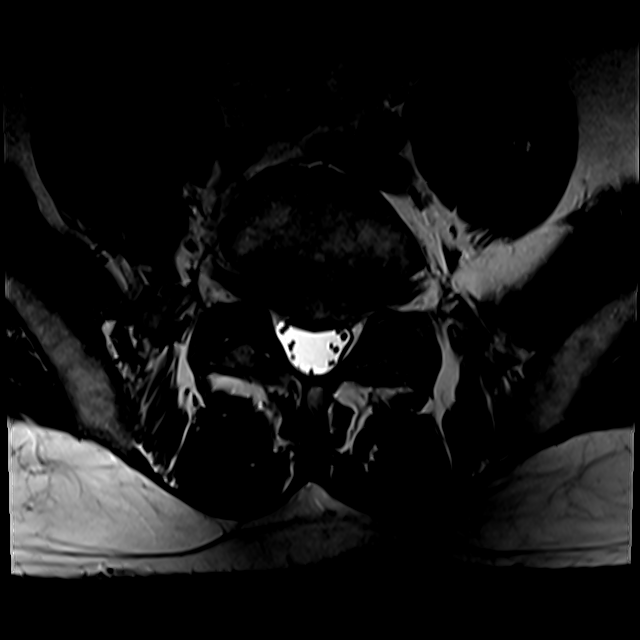
[im 12/41]
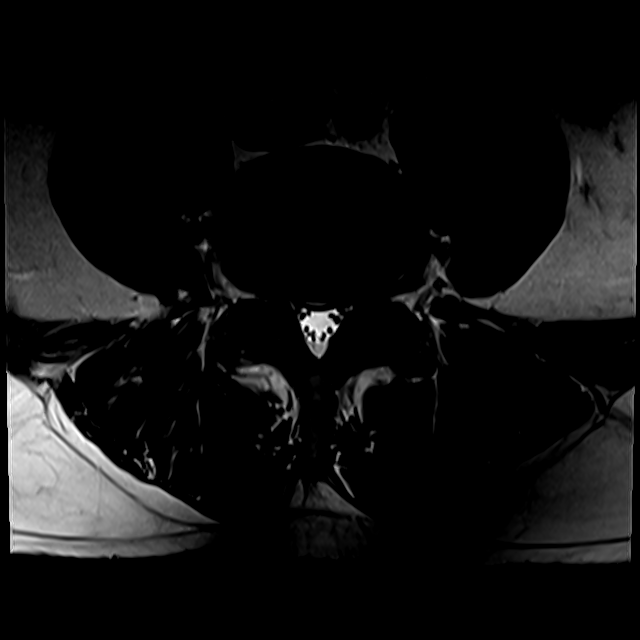
[im 18/41]
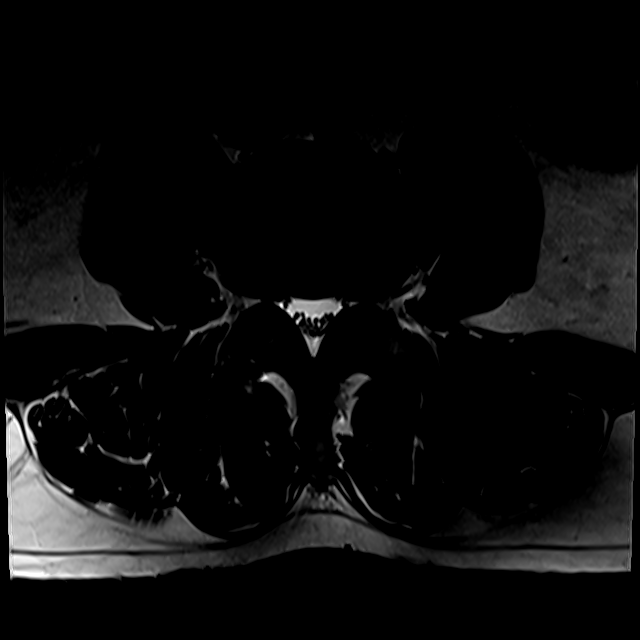
[im 21/41]
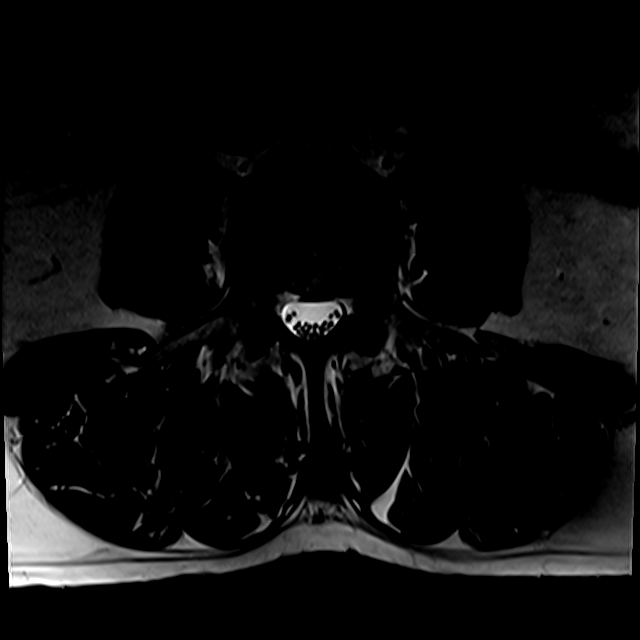
[im 35/41]
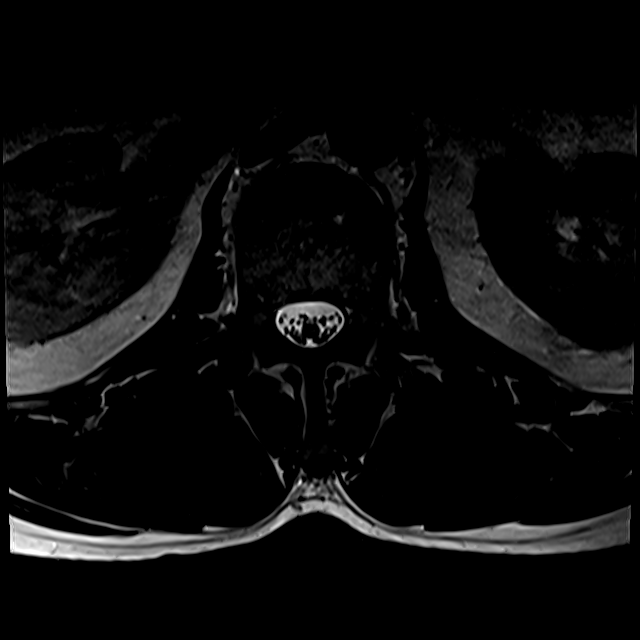

[Series 100: T1 · axial · 4.0mm · 0.28mm/px · z∈[-57,+107]mm · 3 of 41 slices shown (2 of 2)]
[im 6/41]
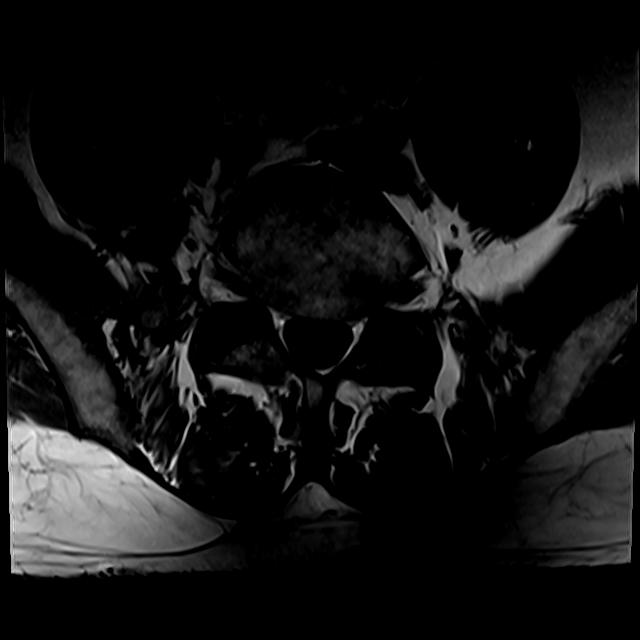
[im 21/41]
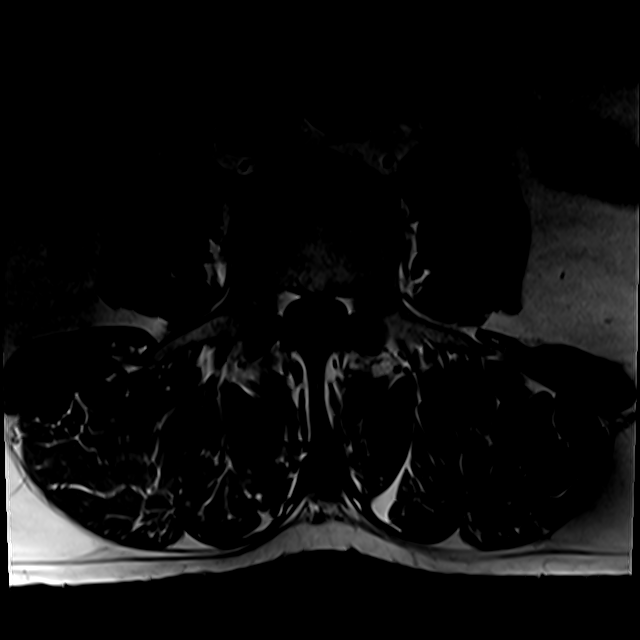
[im 35/41]
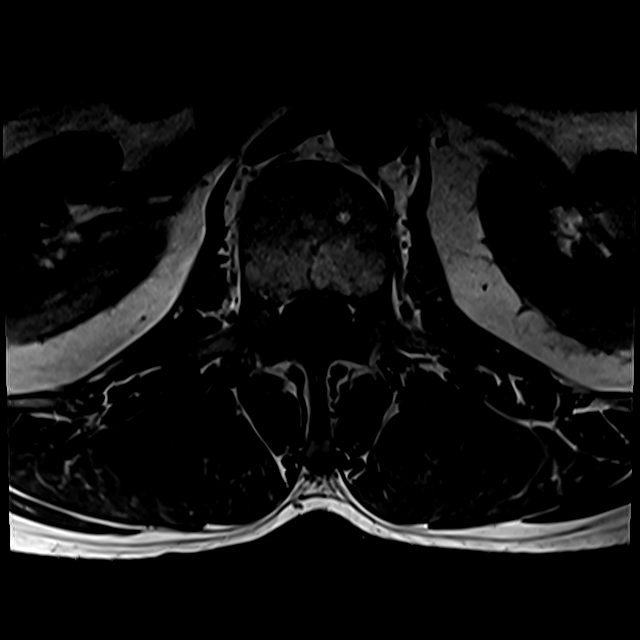

[18 of 48 positions shown; findings below may reference images not displayed]

FINDINGS: Segmentation:  Standard.

Alignment:  Unchanged trace retrolisthesis of L5 on S1.

Vertebrae: No fracture or suspicious osseous lesion. New minimal
degenerative endplate edema at L5-S1.

Conus medullaris and cauda equina: Conus extends to the L1-2 level.
Conus and cauda equina appear normal.

Paraspinal and other soft tissues: New 1 cm exophytic lesion in the
posterior aspect of the lower pole of the left kidney demonstrating
mixed T1 and T2 signal intensity. Additional T2 hyperintense lesions
in both kidneys measuring 4 mm on the left and 8 mm on the right,
likely cysts.

Disc levels:

Disc desiccation and mild disc space narrowing at L4-5 and L5-S1.

T12-L1: Negative.

L1-2: Minimal disc bulging and asymmetric right far lateral
osteophytosis without stenosis, unchanged.

L2-3: New mild disc bulging without stenosis.

L3-4: A central disc protrusion on the prior MRI has regressed, and
there is no residual spinal stenosis. Mild circumferential disc
bulging and mild facet hypertrophy result in minimal to mild
bilateral neural foraminal stenosis.

L4-5: A central disc protrusion with annular fissure has enlarged
and, along with moderate facet hypertrophy, results in
mild-to-moderate bilateral lateral recess stenosis without
significant generalized spinal stenosis. Disc bulging and facet
hypertrophy result in mild bilateral neural foraminal stenosis. A
left foraminal to extraforaminal annular fissure is unchanged.

L5-S1: Retrolisthesis, mild bulging of uncovered disc, and mild
facet hypertrophy result in mild bilateral neural foraminal
stenosis, unchanged. There is a small chronic central annular
fissure. No spinal stenosis.
IMPRESSION: 1. Enlargement of an L4-5 central disc protrusion with
mild-to-moderate bilateral lateral recess stenosis.
2. Regression of L3-4 disc protrusion without residual spinal
stenosis.
3. Mild bilateral neural foraminal stenosis at L4-5 and L5-S1.
4. 1 cm left renal lesion which could reflect a complex cyst or
small solid mass. Renal ultrasound is recommended for further
evaluation.

## 2021-05-04 DIAGNOSIS — E291 Testicular hypofunction: Secondary | ICD-10-CM | POA: Diagnosis not present

## 2021-05-19 DIAGNOSIS — N5201 Erectile dysfunction due to arterial insufficiency: Secondary | ICD-10-CM | POA: Diagnosis not present

## 2021-05-19 DIAGNOSIS — E291 Testicular hypofunction: Secondary | ICD-10-CM | POA: Diagnosis not present

## 2021-05-22 DIAGNOSIS — M9904 Segmental and somatic dysfunction of sacral region: Secondary | ICD-10-CM | POA: Diagnosis not present

## 2021-05-22 DIAGNOSIS — M5386 Other specified dorsopathies, lumbar region: Secondary | ICD-10-CM | POA: Diagnosis not present

## 2021-05-22 DIAGNOSIS — M9902 Segmental and somatic dysfunction of thoracic region: Secondary | ICD-10-CM | POA: Diagnosis not present

## 2021-05-22 DIAGNOSIS — M9903 Segmental and somatic dysfunction of lumbar region: Secondary | ICD-10-CM | POA: Diagnosis not present

## 2021-05-25 DIAGNOSIS — M5116 Intervertebral disc disorders with radiculopathy, lumbar region: Secondary | ICD-10-CM | POA: Diagnosis not present

## 2021-05-25 DIAGNOSIS — M9904 Segmental and somatic dysfunction of sacral region: Secondary | ICD-10-CM | POA: Diagnosis not present

## 2021-05-25 DIAGNOSIS — M9905 Segmental and somatic dysfunction of pelvic region: Secondary | ICD-10-CM | POA: Diagnosis not present

## 2021-05-25 DIAGNOSIS — M9903 Segmental and somatic dysfunction of lumbar region: Secondary | ICD-10-CM | POA: Diagnosis not present

## 2021-05-26 ENCOUNTER — Telehealth: Payer: Self-pay | Admitting: Orthopaedic Surgery

## 2021-05-26 NOTE — Telephone Encounter (Signed)
Please copy 11/15/19 xray to CD. Please let me know when ready, I'll pickup. I have a request for records&xrys. Thanks!

## 2021-05-26 NOTE — Telephone Encounter (Signed)
CD is ready! 

## 2021-05-27 NOTE — Telephone Encounter (Signed)
IC,lmvm records&xray CD is ready to pickup

## 2021-06-05 DIAGNOSIS — M5116 Intervertebral disc disorders with radiculopathy, lumbar region: Secondary | ICD-10-CM | POA: Diagnosis not present

## 2021-06-05 DIAGNOSIS — M9904 Segmental and somatic dysfunction of sacral region: Secondary | ICD-10-CM | POA: Diagnosis not present

## 2021-06-05 DIAGNOSIS — M9905 Segmental and somatic dysfunction of pelvic region: Secondary | ICD-10-CM | POA: Diagnosis not present

## 2021-06-05 DIAGNOSIS — M9903 Segmental and somatic dysfunction of lumbar region: Secondary | ICD-10-CM | POA: Diagnosis not present

## 2021-06-10 ENCOUNTER — Other Ambulatory Visit: Payer: Self-pay | Admitting: Chiropractic Medicine

## 2021-06-10 DIAGNOSIS — M2560 Stiffness of unspecified joint, not elsewhere classified: Secondary | ICD-10-CM

## 2021-06-10 DIAGNOSIS — G8929 Other chronic pain: Secondary | ICD-10-CM

## 2021-06-25 ENCOUNTER — Other Ambulatory Visit: Payer: 59

## 2021-07-01 ENCOUNTER — Ambulatory Visit
Admission: RE | Admit: 2021-07-01 | Discharge: 2021-07-01 | Disposition: A | Discharge status: Home / Self Care | Payer: BC Managed Care – PPO | Source: Ambulatory Visit | Attending: Chiropractic Medicine | Admitting: Chiropractic Medicine

## 2021-07-01 ENCOUNTER — Ambulatory Visit
Admission: RE | Admit: 2021-07-01 | Discharge: 2021-07-01 | Disposition: A | Discharge status: Home / Self Care | Payer: 59 | Source: Ambulatory Visit | Attending: Chiropractic Medicine | Admitting: Chiropractic Medicine

## 2021-07-01 DIAGNOSIS — M25651 Stiffness of right hip, not elsewhere classified: Secondary | ICD-10-CM | POA: Diagnosis not present

## 2021-07-01 DIAGNOSIS — S73191A Other sprain of right hip, initial encounter: Secondary | ICD-10-CM | POA: Diagnosis not present

## 2021-07-01 DIAGNOSIS — M2560 Stiffness of unspecified joint, not elsewhere classified: Secondary | ICD-10-CM

## 2021-07-01 DIAGNOSIS — G8929 Other chronic pain: Secondary | ICD-10-CM

## 2021-07-01 MED ORDER — IOPAMIDOL (ISOVUE-M 200) INJECTION 41%
15.0000 mL | Freq: Once | INTRAMUSCULAR | Status: AC
  Administered 2021-07-01: 15 mL via INTRA_ARTICULAR

## 2021-07-13 ENCOUNTER — Ambulatory Visit: Payer: BC Managed Care – PPO | Admitting: Orthopaedic Surgery

## 2021-07-13 DIAGNOSIS — M1611 Unilateral primary osteoarthritis, right hip: Secondary | ICD-10-CM

## 2021-07-13 DIAGNOSIS — M25551 Pain in right hip: Secondary | ICD-10-CM

## 2021-07-13 NOTE — Progress Notes (Signed)
Bryan Bradley comes in today with worsening right hip pain.  It is gotten to where it is detrimentally affecting his mobility, his quality of life and his activities day living.  The right hip pain is waking him up at night.  We have been seeing him now for almost 3 years as a relates to his hip and back.  He has had steroid injections over the lateral aspect of the right hip and intra-articular steroid injections under direct fluoroscopy in the hip joint.  It is waking him up at night in terms of the hip pain.  He can get a comfortable addition to sleep and.  It does hurt when he plays golf and other activities.  He is otherwise a very active and healthy 61 year old gentleman.  A recent MRI of his right hip which was done as a MRI arthrogram is reviewed with him and shows a significant labral tear which is degenerative in nature from the superior labrum to the anterior labrum.  There is also paralabral cyst.  There is cystic changes in the femoral head and acetabulum as well as significant cartilage thinning.  Examination of his right hip shows pain at the extremes of internal and external rotation as well as stiffness in that right hip.  His left hip is completely asymptomatic.  I went over hip replacement model with him.  He has tried and failed conservative treatment now for at least 2 years.  This also includes chiropractic treatment and physical therapy for his back.  At this point he does wish proceed with a hip replacement for his right hip.  We talked about the risks and benefits of surgery.  I described what to expect from an intraoperative and postoperative course.  All questions and concerns were answered and addressed.  We will work on getting this scheduled sometime in the next month hopefully.

## 2021-07-27 DIAGNOSIS — M5116 Intervertebral disc disorders with radiculopathy, lumbar region: Secondary | ICD-10-CM | POA: Diagnosis not present

## 2021-07-27 DIAGNOSIS — M9905 Segmental and somatic dysfunction of pelvic region: Secondary | ICD-10-CM | POA: Diagnosis not present

## 2021-07-27 DIAGNOSIS — M9904 Segmental and somatic dysfunction of sacral region: Secondary | ICD-10-CM | POA: Diagnosis not present

## 2021-07-27 DIAGNOSIS — M9903 Segmental and somatic dysfunction of lumbar region: Secondary | ICD-10-CM | POA: Diagnosis not present

## 2021-08-03 ENCOUNTER — Other Ambulatory Visit: Payer: Self-pay

## 2021-08-19 ENCOUNTER — Other Ambulatory Visit: Payer: Self-pay | Admitting: Physician Assistant

## 2021-08-19 DIAGNOSIS — M1611 Unilateral primary osteoarthritis, right hip: Secondary | ICD-10-CM

## 2021-08-25 NOTE — Patient Instructions (Signed)
DUE TO COVID-19 ONLY TWO VISITORS  (aged 62 and older)  ARE ALLOWED TO COME WITH YOU AND STAY IN THE WAITING ROOM ONLY DURING PRE OP AND PROCEDURE.   **NO VISITORS ARE ALLOWED IN THE SHORT STAY AREA OR RECOVERY ROOM!!**  IF YOU WILL BE ADMITTED INTO THE HOSPITAL YOU ARE ALLOWED ONLY FOUR SUPPORT PEOPLE DURING VISITATION HOURS ONLY (7 AM -8PM)   The support person(s) must pass our screening, gel in and out, and wear a mask at all times, including in the patient's room. Patients must also wear a mask when staff or their support person are in the room. Visitors GUEST BADGE MUST BE WORN VISIBLY  One adult visitor may remain with you overnight and MUST be in the room by 8 P.M.     Your procedure is scheduled on: 09/04/21   Report to [facility] Main Entrance    Report to admitting at : 6:00 AM   Call this number if you have problems the morning of surgery [phone]   Do not eat food :After Midnight.   After Midnight you may have the following liquids until : 5:30 AM DAY OF SURGERY  Water Black Coffee (sugar ok, NO MILK/CREAM OR CREAMERS)  Tea (sugar ok, NO MILK/CREAM OR CREAMERS) regular and decaf                             Plain Jell-O (NO RED)                                           Fruit ices (not with fruit pulp, NO RED)                                     Popsicles (NO RED)                                                                  Juice: apple, WHITE grape, WHITE cranberry Sports drinks like Gatorade (NO RED)              Drink G2 drink AT 5:30 AM the day of surgery.      The day of surgery:  Drink ONE (1) Pre-Surgery Clear Ensure or G2 at AM the morning of surgery. Drink in one sitting. Do not sip.  This drink was given to you during your hospital  pre-op appointment visit. Nothing else to drink after completing the  Pre-Surgery Clear Ensure or G2.          If you have questions, please contact your surgeon's office.    Oral Hygiene is also important to  reduce your risk of infection.                                    Remember - BRUSH YOUR TEETH THE MORNING OF SURGERY WITH YOUR REGULAR TOOTHPASTE   Do NOT smoke after Midnight   Take these medicines the morning of surgery with A  SIP OF WATER: loratadine,omeprazole.  How to Manage Your Diabetes Before and After Surgery  Why is it important to control my blood sugar before and after surgery? Improving blood sugar levels before and after surgery helps healing and can limit problems. A way of improving blood sugar control is eating a healthy diet by:  Eating less sugar and carbohydrates  Increasing activity/exercise  Talking with your doctor about reaching your blood sugar goals High blood sugars (greater than 180 mg/dL) can raise your risk of infections and slow your recovery, so you will need to focus on controlling your diabetes during the weeks before surgery. Make sure that the doctor who takes care of your diabetes knows about your planned surgery including the date and location.  How do I manage my blood sugar before surgery? Check your blood sugar at least 4 times a day, starting 2 days before surgery, to make sure that the level is not too high or low. Check your blood sugar the morning of your surgery when you wake up and every 2 hours until you get to the Short Stay unit. If your blood sugar is less than 70 mg/dL, you will need to treat for low blood sugar: Do not take insulin. Treat a low blood sugar (less than 70 mg/dL) with  cup of clear juice (cranberry or apple), 4 glucose tablets, OR glucose gel. Recheck blood sugar in 15 minutes after treatment (to make sure it is greater than 70 mg/dL). If your blood sugar is not greater than 70 mg/dL on recheck, call [phone] for further instructions. Report your blood sugar to the short stay nurse when you get to Short Stay.  If you are admitted to the hospital after surgery: Your blood sugar will be checked by the staff and you  will probably be given insulin after surgery (instead of oral diabetes medicines) to make sure you have good blood sugar levels. The goal for blood sugar control after surgery is 80-180 mg/dL.   WHAT DO I DO ABOUT MY DIABETES MEDICATION?  Do not take oral diabetes medicines (pills) the morning of surgery.  THE DAY BEFORE SURGERY, take glimepiride and metformin in the morning.      THE MORNING OF SURGERY, DO NOT TAKE ANY ORAL DIABETIC MEDICATIONS DAY OF YOUR SURGERY  Bring CPAP mask and tubing day of surgery.                              You may not have any metal on your body including hair pins, jewelry, and body piercing             Do not wear make-up, lotions, powders, perfumes/cologne, or deodorant  Do not wear nail polish including gel and S&S, artificial/acrylic nails, or any other type of covering on natural nails including finger and toenails. If you have artificial nails, gel coating, etc. that needs to be removed by a nail salon please have this removed prior to surgery or surgery may need to be canceled/ delayed if the surgeon/ anesthesia feels like they are unable to be safely monitored.   Do not shave  48 hours prior to surgery.               Men may shave face and neck.   Do not bring valuables to the hospital. [facility] IS NOT             RESPONSIBLE   FOR VALUABLES.  Contacts, dentures or bridgework may not be worn into surgery.   Bring small overnight bag day of surgery.   DO NOT BRING YOUR HOME MEDICATIONS TO THE HOSPITAL. PHARMACY WILL DISPENSE MEDICATIONS LISTED ON YOUR MEDICATION LIST TO YOU DURING YOUR ADMISSION IN THE HOSPITAL!    Patients discharged on the day of surgery will not be allowed to drive home.  Someone NEEDS to stay with you for the first 24 hours after anesthesia.   Special Instructions: Bring a copy of your healthcare power of attorney and living will documents         the day of surgery if you haven't scanned them before.               Please read over the following fact sheets you were given: IF YOU HAVE QUESTIONS ABOUT YOUR PRE-OP INSTRUCTIONS PLEASE CALL [phone]     [facility] - Preparing for Surgery Before surgery, you can play an important role.  Because skin is not sterile, your skin needs to be as free of germs as possible.  You can reduce the number of germs on your skin by washing with CHG (chlorahexidine gluconate) soap before surgery.  CHG is an antiseptic cleaner which kills germs and bonds with the skin to continue killing germs even after washing. Please DO NOT use if you have an allergy to CHG or antibacterial soaps.  If your skin becomes reddened/irritated stop using the CHG and inform your nurse when you arrive at Short Stay. Do not shave (including legs and underarms) for at least 48 hours prior to the first CHG shower.  You may shave your face/neck. Please follow these instructions carefully:  1.  Shower with CHG Soap the night before surgery and the  morning of Surgery.  2.  If you choose to wash your hair, wash your hair first as usual with your  normal  shampoo.  3.  After you shampoo, rinse your hair and body thoroughly to remove the  shampoo.                           4.  Use CHG as you would any other liquid soap.  You can apply chg directly  to the skin and wash                       Gently with a scrungie or clean washcloth.  5.  Apply the CHG Soap to your body ONLY FROM THE NECK DOWN.   Do not use on face/ open                           Wound or open sores. Avoid contact with eyes, ears mouth and genitals (private parts).                       Wash face,  Genitals (private parts) with your normal soap.             6.  Wash thoroughly, paying special attention to the area where your surgery  will be performed.  7.  Thoroughly rinse your body with warm water from the neck down.  8.  DO NOT shower/wash with your normal soap after using and rinsing off  the CHG Soap.                9.  Pat yourself  dry with  a clean towel.            10.  Wear clean pajamas.            11.  Place clean sheets on your bed the night of your first shower and do not  sleep with pets. Day of Surgery : Do not apply any lotions/deodorants the morning of surgery.  Please wear clean clothes to the hospital/surgery center.  FAILURE TO FOLLOW THESE INSTRUCTIONS MAY RESULT IN THE CANCELLATION OF YOUR SURGERY PATIENT SIGNATURE_________________________________  NURSE SIGNATURE__________________________________  ________________________________________________________________________   Bryan Bradley  An incentive spirometer is a tool that can help keep your lungs clear and active. This tool measures how well you are filling your lungs with each breath. Taking long deep breaths may help reverse or decrease the chance of developing breathing (pulmonary) problems (especially infection) following: A long period of time when you are unable to move or be active. BEFORE THE PROCEDURE  If the spirometer includes an indicator to show your best effort, your nurse or respiratory therapist will set it to a desired goal. If possible, sit up straight or lean slightly forward. Try not to slouch. Hold the incentive spirometer in an upright position. INSTRUCTIONS FOR USE  Sit on the edge of your bed if possible, or sit up as far as you can in bed or on a chair. Hold the incentive spirometer in an upright position. Breathe out normally. Place the mouthpiece in your mouth and seal your lips tightly around it. Breathe in slowly and as deeply as possible, raising the piston or the ball toward the top of the column. Hold your breath for 3-5 seconds or for as long as possible. Allow the piston or ball to fall to the bottom of the column. Remove the mouthpiece from your mouth and breathe out normally. Rest for a few seconds and repeat Steps 1 through 7 at least 10 times every 1-2 hours when you are awake. Take your time and take a  few normal breaths between deep breaths. The spirometer may include an indicator to show your best effort. Use the indicator as a goal to work toward during each repetition. After each set of 10 deep breaths, practice coughing to be sure your lungs are clear. If you have an incision (the cut made at the time of surgery), support your incision when coughing by placing a pillow or rolled up towels firmly against it. Once you are able to get out of bed, walk around indoors and cough well. You may stop using the incentive spirometer when instructed by your caregiver.  RISKS AND COMPLICATIONS Take your time so you do not get dizzy or light-headed. If you are in pain, you may need to take or ask for pain medication before doing incentive spirometry. It is harder to take a deep breath if you are having pain. AFTER USE Rest and breathe slowly and easily. It can be helpful to keep track of a log of your progress. Your caregiver can provide you with a simple table to help with this. If you are using the spirometer at home, follow these instructions: SEEK MEDICAL CARE IF:  You are having difficultly using the spirometer. You have trouble using the spirometer as often as instructed. Your pain medication is not giving enough relief while using the spirometer. You develop fever of 100.5 F (38.1 C) or higher. SEEK IMMEDIATE MEDICAL CARE IF:  You cough up bloody sputum that had not been present before. You develop fever  of 102 F (38.9 C) or greater. You develop worsening pain at or near the incision site. MAKE SURE YOU:  Understand these instructions. Will watch your condition. Will get help right away if you are not doing well or get worse. Document Released: 05/03/2006 Document Revised: 03/15/2011 Document Reviewed: 07/04/2006 [facility] Patient Information 2014 [place], [place].   ________________________________________________________________________

## 2021-08-26 ENCOUNTER — Other Ambulatory Visit: Payer: Self-pay

## 2021-08-26 ENCOUNTER — Encounter (HOSPITAL_COMMUNITY)
Admission: RE | Admit: 2021-08-26 | Discharge: 2021-08-26 | Disposition: A | Discharge status: Home / Self Care | Payer: BC Managed Care – PPO | Source: Ambulatory Visit | Attending: Orthopaedic Surgery | Admitting: Orthopaedic Surgery

## 2021-08-26 ENCOUNTER — Encounter (HOSPITAL_COMMUNITY): Payer: Self-pay

## 2021-08-26 VITALS — BP 152/92 | HR 63 | Temp 98.2°F | Ht 69.0 in | Wt 226.0 lb

## 2021-08-26 DIAGNOSIS — E119 Type 2 diabetes mellitus without complications: Secondary | ICD-10-CM | POA: Diagnosis not present

## 2021-08-26 DIAGNOSIS — Z01818 Encounter for other preprocedural examination: Secondary | ICD-10-CM | POA: Diagnosis not present

## 2021-08-26 DIAGNOSIS — R9431 Abnormal electrocardiogram [ECG] [EKG]: Secondary | ICD-10-CM | POA: Insufficient documentation

## 2021-08-26 DIAGNOSIS — M1611 Unilateral primary osteoarthritis, right hip: Secondary | ICD-10-CM | POA: Diagnosis not present

## 2021-08-26 HISTORY — DX: Prediabetes: R73.03

## 2021-08-26 HISTORY — DX: Unspecified osteoarthritis, unspecified site: M19.90

## 2021-08-26 LAB — BASIC METABOLIC PANEL
Anion gap: 7 (ref 5–15)
BUN: 21 mg/dL (ref 8–23)
CO2: 27 mmol/L (ref 22–32)
Calcium: 9.5 mg/dL (ref 8.9–10.3)
Chloride: 108 mmol/L (ref 98–111)
Creatinine, Ser: 1.29 mg/dL — ABNORMAL HIGH (ref 0.61–1.24)
GFR, Estimated: >60 mL/min (ref >60)
Glucose, Bld: 138 mg/dL — ABNORMAL HIGH (ref 70–99)
Potassium: 4.4 mmol/L (ref 3.5–5.1)
Sodium: 142 mmol/L (ref 135–145)

## 2021-08-26 LAB — CBC
HCT: 45.3 % (ref 39.0–52.0)
Hemoglobin: 15.1 g/dL (ref 13.0–17.0)
MCH: 28.4 pg (ref 26.0–34.0)
MCHC: 33.3 g/dL (ref 30.0–36.0)
MCV: 85.2 fL (ref 80.0–100.0)
Platelets: 222 10*3/uL (ref 150–400)
RBC: 5.32 MIL/uL (ref 4.22–5.81)
RDW: 13.2 % (ref 11.5–15.5)
WBC: 6.6 10*3/uL (ref 4.0–10.5)
nRBC: 0 % (ref 0.0–0.2)

## 2021-08-26 LAB — HEMOGLOBIN A1C
Hgb A1c MFr Bld: 6.8 % — ABNORMAL HIGH (ref 4.8–5.6)
Mean Plasma Glucose: 148.46 mg/dL

## 2021-08-26 LAB — SURGICAL PCR SCREEN
MRSA, PCR: NEGATIVE
Staphylococcus aureus: NEGATIVE

## 2021-08-26 LAB — GLUCOSE, CAPILLARY: Glucose-Capillary: 125 mg/dL — ABNORMAL HIGH (ref 70–99)

## 2021-08-26 NOTE — Progress Notes (Signed)
For Short Stay: COVID SWAB appointment date: Date of COVID positive in last 90 days:  Bowel Prep reminder:   For Anesthesia: PCP - Dr. Blair Heys Cardiologist -   Chest x-ray -  EKG -  Stress Test -  ECHO -  Cardiac Cath -  Pacemaker/ICD device last checked: Pacemaker orders received: Device Rep notified:  Spinal Cord Stimulator:  Sleep Study -  CPAP -   Fasting Blood Sugar - N/A Checks Blood Sugar ___0__ times a day Date and result of last Hgb A1c-  Blood Thinner Instructions: Aspirin Instructions: Last Dose:  Activity level: Can go up a flight of stairs and activities of daily living without stopping and without chest pain and/or shortness of breath   Able to exercise without chest pain and/or shortness of breath   Unable to go up a flight of stairs without chest pain and/or shortness of breath     Anesthesia review: Hx: Pre-DIA.  Patient denies shortness of breath, fever, cough and chest pain at PAT appointment   Patient verbalized understanding of instructions that were given to them at the PAT appointment. Patient was also instructed that they will need to review over the PAT instructions again at home before surgery.

## 2021-09-01 DIAGNOSIS — M9903 Segmental and somatic dysfunction of lumbar region: Secondary | ICD-10-CM | POA: Diagnosis not present

## 2021-09-01 DIAGNOSIS — M9905 Segmental and somatic dysfunction of pelvic region: Secondary | ICD-10-CM | POA: Diagnosis not present

## 2021-09-01 DIAGNOSIS — M5116 Intervertebral disc disorders with radiculopathy, lumbar region: Secondary | ICD-10-CM | POA: Diagnosis not present

## 2021-09-01 DIAGNOSIS — M9904 Segmental and somatic dysfunction of sacral region: Secondary | ICD-10-CM | POA: Diagnosis not present

## 2021-09-03 NOTE — H&P (Signed)
TOTAL HIP ADMISSION H&P  Patient is admitted for right total hip arthroplasty.  Subjective:  Chief Complaint: right hip pain  HPI: Bryan Bradley, 61 y.o. male, has a history of pain and functional disability in the right hip(s) due to arthritis and patient has failed non-surgical conservative treatments for greater than 12 weeks to include NSAID's and/or analgesics, corticosteriod injections, flexibility and strengthening excercises, and activity modification.  Onset of symptoms was gradual starting 2 years ago with gradually worsening course since that time.The patient noted no past surgery on the right hip(s).  Patient currently rates pain in the right hip at 9 out of 10 with activity. Patient has night pain, worsening of pain with activity and weight bearing, pain that interfers with activities of daily living, and pain with passive range of motion. Patient has evidence of subchondral sclerosis, periarticular osteophytes, and joint space narrowing by imaging studies. This condition presents safety issues increasing the risk of falls.  There is no current active infection.  Patient Active Problem List   Diagnosis Date Noted   Unilateral primary osteoarthritis, right hip 07/13/2021   Plantar fasciitis, right 11/06/2013   Inj muscle, fascia and tendon of prt biceps, left arm, init 11/06/2013   Nodule of flexor tendon sheath Right 3rd Digit 11/06/2013   Past Medical History:  Diagnosis Date   Arthritis    GERD (gastroesophageal reflux disease)    Hyperlipidemia    Pre-diabetes     Past Surgical History:  Procedure Laterality Date   CYST REMOVAL NECK  1969   ROTATOR CUFF REPAIR  2005   right    No current facility-administered medications for this encounter.   Current Outpatient Medications  Medication Sig Dispense Refill Last Dose   atorvastatin (LIPITOR) 10 MG tablet Take 10 mg by mouth daily.      b complex vitamins capsule Take 1 capsule by mouth daily.      cholecalciferol  (VITAMIN D3) 25 MCG (1000 UNIT) tablet Take 1,000 Units by mouth daily.      glimepiride (AMARYL) 1 MG tablet Take 1 mg by mouth every morning.      loratadine (CLARITIN) 10 MG tablet Take 10 mg by mouth daily.      magnesium oxide (MAG-OX) 400 (240 Mg) MG tablet Take 400 mg by mouth daily.      metFORMIN (GLUCOPHAGE-XR) 500 MG 24 hr tablet Take 1,500 mg by mouth daily.      omeprazole (PRILOSEC) 40 MG capsule Take 40 mg by mouth daily as needed (acid reflux).      Testosterone 20.25 MG/ACT (1.62%) GEL Apply 2 Pump topically daily.      Turmeric 500 MG CAPS Take by mouth.      Allergies  Allergen Reactions   Rozerem [Ramelteon] Other (See Comments)    Felt bad, cloudy thoughts   Zoloft [Sertraline] Other (See Comments)    Felt bad    Social History   Tobacco Use   Smoking status: Never   Smokeless tobacco: Current    Types: Chew  Substance Use Topics   Alcohol use: Yes    Comment: rare    Family History  Problem Relation Age of Onset   Lung cancer Father    Colon cancer Neg Hx    Stomach cancer Neg Hx      Review of Systems  All other systems reviewed and are negative.   Objective:  Physical Exam Vitals reviewed.  Constitutional:      Appearance: Normal appearance.  HENT:  Head: Normocephalic and atraumatic.  Eyes:     Extraocular Movements: Extraocular movements intact.     Pupils: Pupils are equal, round, and reactive to light.  Cardiovascular:     Rate and Rhythm: Normal rate and regular rhythm.  Pulmonary:     Effort: Pulmonary effort is normal.     Breath sounds: Normal breath sounds.  Abdominal:     Palpations: Abdomen is soft.  Musculoskeletal:     Cervical back: Normal range of motion and neck supple.     Right hip: Tenderness and bony tenderness present. Decreased range of motion. Decreased strength.  Neurological:     Mental Status: He is alert and oriented to person, place, and time.  Psychiatric:        Behavior: Behavior normal.      Vital signs in last 24 hours:    Labs:   Estimated body mass index is 33.37 kg/m as calculated from the following:   Height as of 08/26/21: 5\' 9"  (1.753 m).   Weight as of 08/26/21: 102.5 kg.   Imaging Review Plain radiographs demonstrate severe degenerative joint disease of the right hip(s). The bone quality appears to be excellent for age and reported activity level.      Assessment/Plan:  End stage arthritis, right hip(s)  The patient history, physical examination, clinical judgement of the provider and imaging studies are consistent with end stage degenerative joint disease of the right hip(s) and total hip arthroplasty is deemed medically necessary. The treatment options including medical management, injection therapy, arthroscopy and arthroplasty were discussed at length. The risks and benefits of total hip arthroplasty were presented and reviewed. The risks due to aseptic loosening, infection, stiffness, dislocation/subluxation,  thromboembolic complications and other imponderables were discussed.  The patient acknowledged the explanation, agreed to proceed with the plan and consent was signed. Patient is being admitted for inpatient treatment for surgery, pain control, PT, OT, prophylactic antibiotics, VTE prophylaxis, progressive ambulation and ADL's and discharge planning.The patient is planning to be discharged home with home health services

## 2021-09-03 NOTE — Anesthesia Preprocedure Evaluation (Addendum)
Anesthesia Evaluation  Patient identified by MRN, date of birth, ID band Patient awake    Reviewed: Allergy & Precautions, NPO status , Patient's Chart, lab work & pertinent test results  Airway Mallampati: I  TM Distance: >3 FB Neck ROM: Full    Dental no notable dental hx. (+) Dental Advisory Given, Teeth Intact   Pulmonary neg pulmonary ROS,    Pulmonary exam normal breath sounds clear to auscultation       Cardiovascular negative cardio ROS Normal cardiovascular exam Rhythm:Regular Rate:Normal     Neuro/Psych negative neurological ROS     GI/Hepatic Neg liver ROS, GERD  Controlled,  Endo/Other  negative endocrine ROS  Renal/GU negative Renal ROS     Musculoskeletal  (+) Arthritis ,   Abdominal (+) + obese,   Peds  Hematology negative hematology ROS (+)   Anesthesia Other Findings   Reproductive/Obstetrics                            Anesthesia Physical Anesthesia Plan  ASA: 2  Anesthesia Plan: Spinal   Post-op Pain Management: Tylenol PO (pre-op)*, Celebrex PO (pre-op)* and Gabapentin PO (pre-op)*   Induction: Intravenous  PONV Risk Score and Plan: 2 and Ondansetron, Dexamethasone, Midazolam and Treatment may vary due to age or medical condition  Airway Management Planned: Simple Face Mask  Additional Equipment:   Intra-op Plan:   Post-operative Plan:   Informed Consent: I have reviewed the patients History and Physical, chart, labs and discussed the procedure including the risks, benefits and alternatives for the proposed anesthesia with the patient or authorized representative who has indicated his/her understanding and acceptance.     Dental advisory given  Plan Discussed with: CRNA  Anesthesia Plan Comments:        Anesthesia Quick Evaluation

## 2021-09-04 ENCOUNTER — Encounter (HOSPITAL_COMMUNITY): Payer: Self-pay | Admitting: Orthopaedic Surgery

## 2021-09-04 ENCOUNTER — Ambulatory Visit (HOSPITAL_COMMUNITY): Payer: BC Managed Care – PPO

## 2021-09-04 ENCOUNTER — Other Ambulatory Visit: Payer: Self-pay

## 2021-09-04 ENCOUNTER — Observation Stay (HOSPITAL_COMMUNITY): Payer: BC Managed Care – PPO

## 2021-09-04 ENCOUNTER — Ambulatory Visit (HOSPITAL_COMMUNITY): Payer: BC Managed Care – PPO | Admitting: Anesthesiology

## 2021-09-04 ENCOUNTER — Encounter (HOSPITAL_COMMUNITY)
Admission: RE | Disposition: A | Discharge status: Home Health | Payer: Self-pay | Source: Home / Self Care | Attending: Orthopaedic Surgery

## 2021-09-04 ENCOUNTER — Observation Stay (HOSPITAL_COMMUNITY)
Admission: RE | Admit: 2021-09-04 | Discharge: 2021-09-05 | Disposition: A | Discharge status: Home Health | Payer: BC Managed Care – PPO | Attending: Orthopaedic Surgery | Admitting: Orthopaedic Surgery

## 2021-09-04 DIAGNOSIS — Z96641 Presence of right artificial hip joint: Secondary | ICD-10-CM | POA: Diagnosis not present

## 2021-09-04 DIAGNOSIS — M1611 Unilateral primary osteoarthritis, right hip: Secondary | ICD-10-CM | POA: Diagnosis not present

## 2021-09-04 DIAGNOSIS — Z79899 Other long term (current) drug therapy: Secondary | ICD-10-CM | POA: Insufficient documentation

## 2021-09-04 DIAGNOSIS — Z471 Aftercare following joint replacement surgery: Secondary | ICD-10-CM | POA: Diagnosis not present

## 2021-09-04 DIAGNOSIS — F17220 Nicotine dependence, chewing tobacco, uncomplicated: Secondary | ICD-10-CM | POA: Diagnosis not present

## 2021-09-04 DIAGNOSIS — Z7984 Long term (current) use of oral hypoglycemic drugs: Secondary | ICD-10-CM | POA: Diagnosis not present

## 2021-09-04 HISTORY — PX: TOTAL HIP ARTHROPLASTY: SHX124

## 2021-09-04 LAB — TYPE AND SCREEN
ABO/RH(D): A POS
Antibody Screen: NEGATIVE

## 2021-09-04 LAB — GLUCOSE, CAPILLARY: Glucose-Capillary: 139 mg/dL — ABNORMAL HIGH (ref 70–99)

## 2021-09-04 LAB — ABO/RH: ABO/RH(D): A POS

## 2021-09-04 SURGERY — ARTHROPLASTY, HIP, TOTAL, ANTERIOR APPROACH
Anesthesia: Spinal | Site: Hip | Laterality: Right

## 2021-09-04 MED ORDER — B COMPLEX VITAMINS PO CAPS: 1.0000 | ORAL_CAPSULE | Freq: Every day | ORAL | Status: DC

## 2021-09-04 MED ORDER — TRANEXAMIC ACID-NACL 1000-0.7 MG/100ML-% IV SOLN
1000.0000 mg | INTRAVENOUS | Status: AC
  Administered 2021-09-04: 1000 mg via INTRAVENOUS
  Filled 2021-09-04: qty 100

## 2021-09-04 MED ORDER — ONDANSETRON HCL 4 MG PO TABS: 4.0000 mg | ORAL_TABLET | Freq: Four times a day (QID) | ORAL | Status: DC | PRN

## 2021-09-04 MED ORDER — MIDAZOLAM HCL 2 MG/2ML IJ SOLN
INTRAMUSCULAR | Status: AC
  Filled 2021-09-04: qty 2

## 2021-09-04 MED ORDER — TAMSULOSIN HCL 0.4 MG PO CAPS
0.4000 mg | ORAL_CAPSULE | Freq: Every day | ORAL | Status: DC
  Administered 2021-09-04: 0.4 mg via ORAL
  Filled 2021-09-04: qty 1

## 2021-09-04 MED ORDER — PANTOPRAZOLE SODIUM 40 MG PO TBEC
40.0000 mg | DELAYED_RELEASE_TABLET | Freq: Every day | ORAL | Status: DC
  Administered 2021-09-04 – 2021-09-05 (×2): 40 mg via ORAL
  Filled 2021-09-04 (×2): qty 1

## 2021-09-04 MED ORDER — DOCUSATE SODIUM 100 MG PO CAPS
100.0000 mg | ORAL_CAPSULE | Freq: Two times a day (BID) | ORAL | Status: DC
  Administered 2021-09-04 – 2021-09-05 (×2): 100 mg via ORAL
  Filled 2021-09-04 (×2): qty 1

## 2021-09-04 MED ORDER — PHENOL 1.4 % MT LIQD: 1.0000 | OROMUCOSAL | Status: DC | PRN

## 2021-09-04 MED ORDER — OXYCODONE HCL 5 MG PO TABS
5.0000 mg | ORAL_TABLET | ORAL | Status: DC | PRN
  Administered 2021-09-04 – 2021-09-05 (×4): 10 mg via ORAL
  Filled 2021-09-04 (×4): qty 2

## 2021-09-04 MED ORDER — PHENYLEPHRINE HCL-NACL 20-0.9 MG/250ML-% IV SOLN
INTRAVENOUS | Status: AC
  Filled 2021-09-04: qty 250

## 2021-09-04 MED ORDER — METHOCARBAMOL 500 MG PO TABS
500.0000 mg | ORAL_TABLET | Freq: Four times a day (QID) | ORAL | Status: DC | PRN
  Administered 2021-09-04 – 2021-09-05 (×2): 500 mg via ORAL
  Filled 2021-09-04 (×2): qty 1

## 2021-09-04 MED ORDER — DIPHENHYDRAMINE HCL 12.5 MG/5ML PO ELIX: 12.5000 mg | ORAL_SOLUTION | ORAL | Status: DC | PRN

## 2021-09-04 MED ORDER — GLIMEPIRIDE 1 MG PO TABS
1.0000 mg | ORAL_TABLET | Freq: Every day | ORAL | Status: DC
  Administered 2021-09-05: 1 mg via ORAL
  Filled 2021-09-04: qty 1

## 2021-09-04 MED ORDER — OXYCODONE HCL 5 MG PO TABS
10.0000 mg | ORAL_TABLET | ORAL | Status: DC | PRN
  Administered 2021-09-04: 15 mg via ORAL
  Filled 2021-09-04: qty 3

## 2021-09-04 MED ORDER — PROPOFOL 500 MG/50ML IV EMUL
INTRAVENOUS | Status: DC | PRN
  Administered 2021-09-04: 100 ug/kg/min via INTRAVENOUS

## 2021-09-04 MED ORDER — ONDANSETRON HCL 4 MG/2ML IJ SOLN: 4.0000 mg | Freq: Four times a day (QID) | INTRAMUSCULAR | Status: DC | PRN

## 2021-09-04 MED ORDER — PROPOFOL 10 MG/ML IV BOLUS
INTRAVENOUS | Status: DC | PRN
  Administered 2021-09-04: 40 mg via INTRAVENOUS

## 2021-09-04 MED ORDER — GABAPENTIN 300 MG PO CAPS
ORAL_CAPSULE | ORAL | Status: AC
  Filled 2021-09-04: qty 1

## 2021-09-04 MED ORDER — METFORMIN HCL ER 500 MG PO TB24: 1500.0000 mg | ORAL_TABLET | Freq: Every day | ORAL | Status: DC

## 2021-09-04 MED ORDER — HYDROMORPHONE HCL 1 MG/ML IJ SOLN: 0.5000 mg | INTRAMUSCULAR | Status: DC | PRN

## 2021-09-04 MED ORDER — ATORVASTATIN CALCIUM 10 MG PO TABS
10.0000 mg | ORAL_TABLET | Freq: Every day | ORAL | Status: DC
  Administered 2021-09-04 – 2021-09-05 (×2): 10 mg via ORAL
  Filled 2021-09-04 (×2): qty 1

## 2021-09-04 MED ORDER — 0.9 % SODIUM CHLORIDE (POUR BTL) OPTIME
TOPICAL | Status: DC | PRN
  Administered 2021-09-04: 1000 mL

## 2021-09-04 MED ORDER — MAGNESIUM OXIDE -MG SUPPLEMENT 400 (240 MG) MG PO TABS
400.0000 mg | ORAL_TABLET | Freq: Every day | ORAL | Status: DC
  Administered 2021-09-04 – 2021-09-05 (×2): 400 mg via ORAL
  Filled 2021-09-04 (×2): qty 1

## 2021-09-04 MED ORDER — HYDROMORPHONE HCL 1 MG/ML IJ SOLN
0.2500 mg | INTRAMUSCULAR | Status: DC | PRN
  Administered 2021-09-04: 0.25 mg via INTRAVENOUS

## 2021-09-04 MED ORDER — HYDROMORPHONE HCL 1 MG/ML IJ SOLN
INTRAMUSCULAR | Status: AC
  Administered 2021-09-04: 0.5 mg via INTRAVENOUS
  Filled 2021-09-04: qty 1

## 2021-09-04 MED ORDER — PROPOFOL 1000 MG/100ML IV EMUL
INTRAVENOUS | Status: AC
  Filled 2021-09-04: qty 100

## 2021-09-04 MED ORDER — GABAPENTIN 100 MG PO CAPS: 100.0000 mg | ORAL_CAPSULE | Freq: Three times a day (TID) | ORAL | Status: DC | PRN

## 2021-09-04 MED ORDER — GABAPENTIN 600 MG PO TABS
300.0000 mg | ORAL_TABLET | Freq: Once | ORAL | Status: AC
  Administered 2021-09-04: 300 mg via ORAL
  Filled 2021-09-04: qty 0.5

## 2021-09-04 MED ORDER — POVIDONE-IODINE 10 % EX SWAB
2.0000 | Freq: Once | CUTANEOUS | Status: AC
  Administered 2021-09-04: 2 via TOPICAL

## 2021-09-04 MED ORDER — STERILE WATER FOR IRRIGATION IR SOLN
Status: DC | PRN
  Administered 2021-09-04: 2000 mL

## 2021-09-04 MED ORDER — B COMPLEX-C PO TABS
1.0000 | ORAL_TABLET | Freq: Every day | ORAL | Status: DC
  Administered 2021-09-05: 1 via ORAL
  Filled 2021-09-04: qty 1

## 2021-09-04 MED ORDER — MENTHOL 3 MG MT LOZG: 1.0000 | LOZENGE | OROMUCOSAL | Status: DC | PRN

## 2021-09-04 MED ORDER — CELECOXIB 200 MG PO CAPS
200.0000 mg | ORAL_CAPSULE | Freq: Once | ORAL | Status: AC
  Administered 2021-09-04: 200 mg via ORAL
  Filled 2021-09-04: qty 1

## 2021-09-04 MED ORDER — ALUM & MAG HYDROXIDE-SIMETH 200-200-20 MG/5ML PO SUSP: 30.0000 mL | ORAL | Status: DC | PRN

## 2021-09-04 MED ORDER — VITAMIN D 25 MCG (1000 UNIT) PO TABS
1000.0000 [IU] | ORAL_TABLET | Freq: Every day | ORAL | Status: DC
  Administered 2021-09-04 – 2021-09-05 (×2): 1000 [IU] via ORAL
  Filled 2021-09-04 (×2): qty 1

## 2021-09-04 MED ORDER — METHOCARBAMOL 500 MG IVPB - SIMPLE MED: 500.0000 mg | Freq: Four times a day (QID) | INTRAVENOUS | Status: DC | PRN

## 2021-09-04 MED ORDER — BUPIVACAINE IN DEXTROSE 0.75-8.25 % IT SOLN
INTRATHECAL | Status: DC | PRN
  Administered 2021-09-04: 1.6 mL via INTRATHECAL

## 2021-09-04 MED ORDER — METHOCARBAMOL 500 MG IVPB - SIMPLE MED
INTRAVENOUS | Status: AC
  Administered 2021-09-04: 500 mg via INTRAVENOUS
  Filled 2021-09-04: qty 55

## 2021-09-04 MED ORDER — DEXAMETHASONE SODIUM PHOSPHATE 10 MG/ML IJ SOLN
INTRAMUSCULAR | Status: AC
Start: 2021-09-04 — End: ?
  Filled 2021-09-04: qty 1

## 2021-09-04 MED ORDER — CEFAZOLIN SODIUM-DEXTROSE 1-4 GM/50ML-% IV SOLN
1.0000 g | Freq: Four times a day (QID) | INTRAVENOUS | Status: AC
  Administered 2021-09-04 (×2): 1 g via INTRAVENOUS
  Filled 2021-09-04 (×2): qty 50

## 2021-09-04 MED ORDER — CHLORHEXIDINE GLUCONATE 0.12 % MT SOLN
15.0000 mL | Freq: Once | OROMUCOSAL | Status: AC
  Administered 2021-09-04: 15 mL via OROMUCOSAL

## 2021-09-04 MED ORDER — POLYETHYLENE GLYCOL 3350 17 G PO PACK: 17.0000 g | PACK | Freq: Every day | ORAL | Status: DC | PRN

## 2021-09-04 MED ORDER — MIDAZOLAM HCL 2 MG/2ML IJ SOLN
INTRAMUSCULAR | Status: DC | PRN
  Administered 2021-09-04: 2 mg via INTRAVENOUS

## 2021-09-04 MED ORDER — MEPERIDINE HCL 50 MG/ML IJ SOLN: 6.2500 mg | INTRAMUSCULAR | Status: DC | PRN

## 2021-09-04 MED ORDER — PROMETHAZINE HCL 25 MG/ML IJ SOLN: 6.2500 mg | INTRAMUSCULAR | Status: DC | PRN

## 2021-09-04 MED ORDER — ACETAMINOPHEN 500 MG PO TABS
1000.0000 mg | ORAL_TABLET | Freq: Once | ORAL | Status: AC
  Administered 2021-09-04: 1000 mg via ORAL
  Filled 2021-09-04: qty 2

## 2021-09-04 MED ORDER — ACETAMINOPHEN 325 MG PO TABS
325.0000 mg | ORAL_TABLET | Freq: Four times a day (QID) | ORAL | Status: DC | PRN
  Administered 2021-09-05: 650 mg via ORAL
  Filled 2021-09-04: qty 2

## 2021-09-04 MED ORDER — DEXAMETHASONE SODIUM PHOSPHATE 10 MG/ML IJ SOLN
INTRAMUSCULAR | Status: DC | PRN
  Administered 2021-09-04: 10 mg via INTRAVENOUS

## 2021-09-04 MED ORDER — CEFAZOLIN SODIUM-DEXTROSE 2-4 GM/100ML-% IV SOLN
2.0000 g | INTRAVENOUS | Status: AC
  Administered 2021-09-04: 2 g via INTRAVENOUS
  Filled 2021-09-04: qty 100

## 2021-09-04 MED ORDER — LACTATED RINGERS IV SOLN: INTRAVENOUS | Status: DC

## 2021-09-04 MED ORDER — LORATADINE 10 MG PO TABS
10.0000 mg | ORAL_TABLET | Freq: Every day | ORAL | Status: DC
  Administered 2021-09-04 – 2021-09-05 (×2): 10 mg via ORAL
  Filled 2021-09-04 (×2): qty 1

## 2021-09-04 MED ORDER — SODIUM CHLORIDE 0.9 % IV SOLN
INTRAVENOUS | Status: DC
Start: 2021-09-04 — End: 2021-09-05

## 2021-09-04 MED ORDER — ASPIRIN 81 MG PO CHEW
81.0000 mg | CHEWABLE_TABLET | Freq: Two times a day (BID) | ORAL | Status: DC
  Administered 2021-09-04 – 2021-09-05 (×2): 81 mg via ORAL
  Filled 2021-09-04 (×2): qty 1

## 2021-09-04 MED ORDER — ONDANSETRON HCL 4 MG/2ML IJ SOLN
INTRAMUSCULAR | Status: DC | PRN
  Administered 2021-09-04: 4 mg via INTRAVENOUS

## 2021-09-04 MED ORDER — ORAL CARE MOUTH RINSE: 15.0000 mL | Freq: Once | OROMUCOSAL | Status: AC

## 2021-09-04 MED ORDER — METOCLOPRAMIDE HCL 5 MG/ML IJ SOLN: 5.0000 mg | Freq: Three times a day (TID) | INTRAMUSCULAR | Status: DC | PRN

## 2021-09-04 MED ORDER — SODIUM CHLORIDE 0.9 % IR SOLN
Status: DC | PRN
  Administered 2021-09-04: 1000 mL

## 2021-09-04 MED ORDER — METOCLOPRAMIDE HCL 5 MG PO TABS: 5.0000 mg | ORAL_TABLET | Freq: Three times a day (TID) | ORAL | Status: DC | PRN

## 2021-09-04 MED ORDER — ONDANSETRON HCL 4 MG/2ML IJ SOLN
INTRAMUSCULAR | Status: AC
  Filled 2021-09-04: qty 2

## 2021-09-04 SURGICAL SUPPLY — 41 items
ACETAB CUP W/GRIPTION 54 (Plate) ×1 IMPLANT
BAG COUNTER SPONGE SURGICOUNT (BAG) ×1 IMPLANT
BAG ZIPLOCK 12X15 (MISCELLANEOUS) IMPLANT
BENZOIN TINCTURE PRP APPL 2/3 (GAUZE/BANDAGES/DRESSINGS) IMPLANT
BLADE SAW SGTL 18X1.27X75 (BLADE) ×1 IMPLANT
CLOSURE STERI STRIP 1/2 X4 (GAUZE/BANDAGES/DRESSINGS) IMPLANT
COVER PERINEAL POST (MISCELLANEOUS) ×1 IMPLANT
COVER SURGICAL LIGHT HANDLE (MISCELLANEOUS) ×1 IMPLANT
CUP ACETAB W/GRIPTION 54 (Plate) IMPLANT
DRAPE FOOT SWITCH (DRAPES) ×1 IMPLANT
DRAPE STERI IOBAN 125X83 (DRAPES) ×1 IMPLANT
DRAPE U-SHAPE 47X51 STRL (DRAPES) ×2 IMPLANT
DRSG AQUACEL AG ADV 3.5X10 (GAUZE/BANDAGES/DRESSINGS) ×1 IMPLANT
DURAPREP 26ML APPLICATOR (WOUND CARE) ×1 IMPLANT
ELECT REM PT RETURN 15FT ADLT (MISCELLANEOUS) ×1 IMPLANT
GAUZE XEROFORM 1X8 LF (GAUZE/BANDAGES/DRESSINGS) ×1 IMPLANT
GLOVE BIO SURGEON STRL SZ7.5 (GLOVE) ×1 IMPLANT
GLOVE BIOGEL PI IND STRL 8 (GLOVE) ×2 IMPLANT
GLOVE BIOGEL PI INDICATOR 8 (GLOVE) ×2
GLOVE ECLIPSE 8.0 STRL XLNG CF (GLOVE) ×1 IMPLANT
GOWN STRL REUS W/ TWL XL LVL3 (GOWN DISPOSABLE) ×2 IMPLANT
GOWN STRL REUS W/TWL XL LVL3 (GOWN DISPOSABLE) ×2
HANDPIECE INTERPULSE COAX TIP (DISPOSABLE) ×1
HEAD CERAMIC 36 PLUS5 (Hips) IMPLANT
HOLDER FOLEY CATH W/STRAP (MISCELLANEOUS) ×1 IMPLANT
KIT TURNOVER KIT A (KITS) IMPLANT
LINER NEUTRAL 54X36MM PLUS 4 (Hips) IMPLANT
PACK ANTERIOR HIP CUSTOM (KITS) ×1 IMPLANT
SET HNDPC FAN SPRY TIP SCT (DISPOSABLE) ×1 IMPLANT
STAPLER VISISTAT 35W (STAPLE) IMPLANT
STEM FEMORAL SZ5 HIGH ACTIS (Stem) IMPLANT
STRIP CLOSURE SKIN 1/2X4 (GAUZE/BANDAGES/DRESSINGS) IMPLANT
SUT ETHIBOND NAB CT1 #1 30IN (SUTURE) ×1 IMPLANT
SUT ETHILON 2 0 PS N (SUTURE) IMPLANT
SUT MNCRL AB 4-0 PS2 18 (SUTURE) IMPLANT
SUT VIC AB 0 CT1 36 (SUTURE) ×1 IMPLANT
SUT VIC AB 1 CT1 36 (SUTURE) ×1 IMPLANT
SUT VIC AB 2-0 CT1 27 (SUTURE) ×2
SUT VIC AB 2-0 CT1 TAPERPNT 27 (SUTURE) ×2 IMPLANT
TRAY FOLEY MTR SLVR 16FR STAT (SET/KITS/TRAYS/PACK) IMPLANT
YANKAUER SUCT BULB TIP NO VENT (SUCTIONS) ×1 IMPLANT

## 2021-09-04 NOTE — Op Note (Signed)
Operative Note  Date of operation: 09/04/2021 Preoperative diagnosis: Right hip osteoarthritis Postoperative diagnosis: Same  Procedure: Right direct anterior total hip arthroplasty  Implants: DePuy sector GRIPTION acetabular opponent size 54, size 36+4 polythene liner, size 5 Actis femoral component with high offset, size 36+5 ceramic ball  Surgeon: Vanita Panda. Magnus Ivan, MD Assistant: Rexene Edison, PA-C  Anesthesia: Spinal Antibiotics: 2 g IV Ancef EBL: 350 cc Complications: None  Indications: The patient is an active 61 year old with well-documented arthritis involving his right hip.  It has become debilitating in terms of the pain.  It is detrimentally affecting his mobility, his quality of life and his actives daily living.  He has tried and failed conservative treatment for well over few years now.  A MRI of his hip did show degenerative able tearing as well as moderate arthritis of the weightbearing surface of the femoral head.  At this point with very conservative treatment does wish to proceed with a total hip arthroplasty.  We did discuss the risk of acute blood loss anemia, nerve vessel injury, fracture, infection, DVT, implant failure, leg length differences in wound healing issues.  We talked about her goals being decreased pain, improve mobility and hopefully improve quality of life.  Procedure description: After informed consent was then appropriate right hip was marked, the patient was brought to the operating room and set up on the stretcher where spinal anesthesia was obtained.  He was then laid in supine position on stretcher and a Foley catheter was placed.  Traction boots were then placed on both his feet and he was placed supine on the Hana fracture table with a perineal post in place.  His right operative hip was assessed radiographically and then prepped and draped with DuraPrep and sterile drapes.  Timeout was called and he was identified as correct patient and the  correct right hip.  I then made incision just inferior and posterior to the anterior superior iliac spine and carried this slightly obliquely down the leg.  Dissection was carried down to the tensor fascia lata muscle and the tensor fascia was then divided longitudinally to proceed with direct intraoperative to the hip.  Circumflex vessels were identified and cauterized and identified the hip capsule out of the hip capsule and L-type format.  There was just a mild effusion.  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  Cobra retractors were placed around the medial lateral femoral neck and a femoral neck cut was made with an oscillating saw just proximal to the lesser trochanter and completed with an osteotome.  A corkscrew guide was placed in the femoral head and the femoral head was removed in its entirety and there was a area of cartilage wear of the weightbearing surface.  We could see there was degenerative labral tearing as well.  Remnants of the acetabular labrum were then removed and a bent Hohmann was placed over the medial acetabular rim.  We then began reaming and direct visualization from a size 43 reamer and stepwise increments going to size 53 reamer with all reamers placed under direct visualization and the last reamer was placed under direct fluoroscopy so we could obtain our depth of reaming, our inclination, and our anteversion.  We then placed the real DePuy sector GRIPTION acetabular then a size 54 and went with a 36+4 polyethylene liner based off his offset and medialization of the cup.  Attention was then turned to the femur.  With the leg externally rotated, extended and add ducted, a Mueller retractor  was placed medially and a Hohmann retractor behind the greater trochanter.  The lateral joint capsule was released and a box cutting osteotome was used to enter the femoral canal.  Broaching was then started from a size 0 going to a size 5.  Based off his offset we trialed a high  offset femoral neck and a 36+1.5 hip ball and reduced this into the pelvis.  We was stable we felt like we needed just a little more offset and leg length.  We dislocated the hip and removed the trial components.  We then placed the real Actis femoral component size 5 with high offset and the real 36+5 ceramic hip ball.  Again this was reduced in the acetabulum and we assessed radiographically and clinically and were pleased with leg length, offset, range of motion and stability.  The soft tissue was then irrigated with normal saline solution.  The joint capsule was closed with interrupted #1 Ethibond suture followed by #1 Vicryl close the tensor fascia.  0 Vicryl was used to close the deep tissue and 2-0 Vicryl used to close subcutaneous tissue.  Subcuticular Monocryl suture was then applied as well as Steri-Strips.  An Aquacel dressing was placed.  Patient was then taken off of the Hana table and taken recovery in stable addition with all final counts being correct and no complications noted.  Of note Rexene Edison PA-C assisted in entire case from beginning to end and assistance was crucial and medically necessary for soft tissue management and retraction, helping guide implant placement and a layered closure of the wound.

## 2021-09-04 NOTE — Plan of Care (Signed)
  Problem: Activity: Goal: Ability to avoid complications of mobility impairment will improve Outcome: Progressing Goal: Ability to tolerate increased activity will improve Outcome: Progressing   Problem: Pain Management: Goal: Pain level will decrease with appropriate interventions Outcome: Progressing   Problem: Safety: Goal: Ability to remain free from injury will improve Outcome: Progressing   

## 2021-09-04 NOTE — Transfer of Care (Signed)
Immediate Anesthesia Transfer of Care Note  Patient: Bryan Bradley  Procedure(s) Performed: RIGHT TOTAL HIP ARTHROPLASTY ANTERIOR APPROACH (Right: Hip)  Patient Location: PACU  Anesthesia Type:Spinal  Level of Consciousness: awake and drowsy  Airway & Oxygen Therapy: Patient Spontanous Breathing and Patient connected to face mask oxygen  Post-op Assessment: Report given to RN and Post -op Vital signs reviewed and stable  Post vital signs: Reviewed and stable  Last Vitals:  Vitals Value Taken Time  BP 105/67 09/04/21 1010  Temp    Pulse 65 09/04/21 1012  Resp 14 09/04/21 1012  SpO2 100 % 09/04/21 1012  Vitals shown include unvalidated device data.  Last Pain:  Vitals:   09/04/21 0720  TempSrc:   PainSc: 0-No pain      Patients Stated Pain Goal: 2 (09/04/21 0720)  Complications: No notable events documented.

## 2021-09-04 NOTE — Evaluation (Signed)
Physical Therapy Evaluation Patient Details Name: Bryan Bradley MRN: [id number] DOB: [birth year] Today's Date: 09/04/2021  History of Present Illness  pt is a 61yo male presenting s/p R-THa, AA on 09/04/21. PMH: HLD, GERD, pre-diabetes, R-rotator cuff repair 2005.  Clinical Impression  Bryan Bradley is a 61 y.o. male POD 0 s/p R-THA, AA. Patient reports independence with mobility at baseline. Patient is now limited by functional impairments (see PT problem list below) and requires min assist for bed mobility and min guard for transfers. Patient was able to ambulate 20 feet with RW and min guard level of assist. Patient instructed in exercise to facilitate ROM and circulation to manage edema. Provided incentive spirometer and with Vcs pt able to achieve . Patient will benefit from continued skilled PT interventions to address impairments and progress towards PLOF. Acute PT will follow to progress mobility and stair training in preparation for safe discharge home.       Recommendations for follow up therapy are one component of a multi-disciplinary discharge planning process, led by the attending physician.  Recommendations may be updated based on patient status, additional functional criteria and insurance authorization.  Follow Up Recommendations Follow physician's recommendations for discharge plan and follow up therapies      Assistance Recommended at Discharge Set up Supervision/Assistance  Patient can return home with the following  A little help with walking and/or transfers;A little help with bathing/dressing/bathroom;Assistance with cooking/housework;Assist for transportation;Help with stairs or ramp for entrance    Equipment Recommendations None recommended by PT  Recommendations for Other Services       Functional Status Assessment Patient has had a recent decline in their functional status and demonstrates the ability to make significant improvements in function in a reasonable  and predictable amount of time.     Precautions / Restrictions Precautions Precautions: Fall Restrictions Weight Bearing Restrictions: No Other Position/Activity Restrictions: wbat      Mobility  Bed Mobility Overal bed mobility: Needs Assistance Bed Mobility: Supine to Sit     Supine to sit: Min assist     General bed mobility comments: Min assist to bring RLE off bed    Transfers Overall transfer level: Needs assistance Equipment used: Rolling walker (2 wheels) Transfers: Sit to/from Stand Sit to Stand: Min guard, From elevated surface           General transfer comment: For safety only, multimodal cues to power up with BUE from bed surface    Ambulation/Gait Ambulation/Gait assistance: Min guard Gait Distance (Feet): 20 Feet Assistive device: Rolling walker (2 wheels) Gait Pattern/deviations: Step-to pattern Gait velocity: decreased     General Gait Details: Pt ambulated with RW and min guard, no physical assist or overt LOB noted. Pt reporting some mild tingling/numbness in bilateral feet and demonstrated occasional R foot sliding.  Stairs            Wheelchair Mobility    Modified Rankin (Stroke Patients Only)       Balance Overall balance assessment: Needs assistance Sitting-balance support: Feet supported, No upper extremity supported Sitting balance-Leahy Scale: Good     Standing balance support: Reliant on assistive device for balance, During functional activity, Bilateral upper extremity supported Standing balance-Leahy Scale: Poor                               Pertinent Vitals/Pain Pain Assessment Pain Assessment: 0-10 Pain Score: 7  Pain Location: righthip Pain Descriptors /  Indicators: Operative site guarding Pain Intervention(s): Limited activity within patient's tolerance, Monitored during session, Repositioned, Ice applied    Home Living Family/patient expects to be discharged to:: Private residence Living  Arrangements: Spouse/significant other Available Help at Discharge: Family;Available 24 hours/day Type of Home: House Home Access: Stairs to enter Entrance Stairs-Rails: None Entrance Stairs-Number of Steps: 2 Alternate Level Stairs-Number of Steps: 14 Home Layout: Able to live on main level with bedroom/bathroom;Two level (Pt's clothing upstairs) Home Equipment: Rolling Walker (2 wheels);Cane - single point;BSC/3in1;Shower seat Additional Comments: Pt's wife Efraim Kaufmann just diagnosed this week with breast cancer.    Prior Function Prior Level of Function : Independent/Modified Independent;Working/employed;Driving Passenger transport manager)             Mobility Comments: IND ADLs Comments: IND     Hand Dominance        Extremity/Trunk Assessment   Upper Extremity Assessment Upper Extremity Assessment: Overall WFL for tasks assessed    Lower Extremity Assessment Lower Extremity Assessment: RLE deficits/detail;LLE deficits/detail RLE Deficits / Details: MMT ank DF/PF 5/5 RLE Sensation: WNL LLE Deficits / Details: MMT ank DF/PF 5/5 LLE Sensation: WNL    Cervical / Trunk Assessment Cervical / Trunk Assessment: Normal  Communication   Communication: No difficulties  Cognition Arousal/Alertness: Awake/alert Behavior During Therapy: WFL for tasks assessed/performed Overall Cognitive Status: Within Functional Limits for tasks assessed                                          General Comments General comments (skin integrity, edema, etc.): Wife Efraim Kaufmann present for session    Exercises Total Joint Exercises Ankle Circles/Pumps: AROM, Both, 20 reps   Assessment/Plan    PT Assessment Patient needs continued PT services  PT Problem List Decreased strength;Decreased range of motion;Decreased activity tolerance;Decreased balance;Decreased mobility;Decreased coordination;Pain       PT Treatment Interventions DME instruction;Gait training;Stair training;Functional  mobility training;Therapeutic activities;Therapeutic exercise;Balance training;Neuromuscular re-education;Patient/family education    PT Goals (Current goals can be found in the Care Plan section)  Acute Rehab PT Goals Patient Stated Goal: To get back working, playing golf PT Goal Formulation: With patient Time For Goal Achievement: 09/11/21 Potential to Achieve Goals: Good    Frequency 7X/week     Co-evaluation               AM-PAC PT "6 Clicks" Mobility  Outcome Measure Help needed turning from your back to your side while in a flat bed without using bedrails?: None Help needed moving from lying on your back to sitting on the side of a flat bed without using bedrails?: A Little Help needed moving to and from a bed to a chair (including a wheelchair)?: A Little Help needed standing up from a chair using your arms (e.g., wheelchair or bedside chair)?: A Little Help needed to walk in hospital room?: A Little Help needed climbing 3-5 steps with a railing? : A Little 6 Click Score: 19    End of Session Equipment Utilized During Treatment: Gait belt Activity Tolerance: Patient tolerated treatment well;No increased pain Patient left: in chair;with call bell/phone within reach;with chair alarm set;with family/visitor present;with SCD's reapplied Nurse Communication: Mobility status PT Visit Diagnosis: Pain;Difficulty in walking, not elsewhere classified (R26.2) Pain - Right/Left: Right Pain - part of body: Hip    Time: 9562-1308 PT Time Calculation (min) (ACUTE ONLY): 31 min   Charges:   PT  Evaluation $PT Eval Low Complexity: 1 Low PT Treatments $Gait Training: 8-22 mins        Jamesetta Geralds, PT, DPT [facility] Department Office: [phone] Pager: [phone]  Jamesetta Geralds 09/04/2021, 5:40 PM

## 2021-09-04 NOTE — Anesthesia Procedure Notes (Addendum)
Spinal  Patient location during procedure: OR Start time: 09/04/2021 8:30 AM End time: 09/04/2021 8:36 AM Reason for block: surgical anesthesia Staffing Performed: anesthesiologist  Anesthesiologist: Nolon Nations, MD Performed by: Nolon Nations, MD Authorized by: Nolon Nations, MD   Preanesthetic Checklist Completed: patient identified, IV checked, site marked, risks and benefits discussed, surgical consent, monitors and equipment checked, pre-op evaluation and timeout performed Spinal Block Patient position: sitting Prep: DuraPrep and site prepped and draped Patient monitoring: heart rate, continuous pulse ox and blood pressure Approach: midline Location: L2-3 Injection technique: single-shot Needle Needle type: Spinocan  Needle gauge: 25 G Needle length: 9 cm Additional Notes Expiration date of kit checked and confirmed. Patient tolerated procedure well, without complications.

## 2021-09-04 NOTE — Anesthesia Postprocedure Evaluation (Signed)
Anesthesia Post Note  Patient: Bryan Bradley  Procedure(s) Performed: RIGHT TOTAL HIP ARTHROPLASTY ANTERIOR APPROACH (Right: Hip)     Patient location during evaluation: PACU Anesthesia Type: Spinal Level of consciousness: awake and alert Pain management: pain level controlled Vital Signs Assessment: post-procedure vital signs reviewed and stable Respiratory status: spontaneous breathing, nonlabored ventilation, respiratory function stable and patient connected to nasal cannula oxygen Cardiovascular status: blood pressure returned to baseline and stable Postop Assessment: no apparent nausea or vomiting Anesthetic complications: no   No notable events documented.  Last Vitals:  Vitals:   09/04/21 1330 09/04/21 1702  BP: 118/70 127/69  Pulse: (!) 57 62  Resp: 18 15  Temp: 36.4 C 36.5 C  SpO2: 99% 95%    Last Pain:  Vitals:   09/04/21 1702  TempSrc: Oral  PainSc:                  Lewie Loron

## 2021-09-05 ENCOUNTER — Other Ambulatory Visit: Payer: Self-pay | Admitting: Physician Assistant

## 2021-09-05 DIAGNOSIS — F17220 Nicotine dependence, chewing tobacco, uncomplicated: Secondary | ICD-10-CM | POA: Diagnosis not present

## 2021-09-05 DIAGNOSIS — Z7984 Long term (current) use of oral hypoglycemic drugs: Secondary | ICD-10-CM | POA: Diagnosis not present

## 2021-09-05 DIAGNOSIS — Z79899 Other long term (current) drug therapy: Secondary | ICD-10-CM | POA: Diagnosis not present

## 2021-09-05 DIAGNOSIS — M1611 Unilateral primary osteoarthritis, right hip: Secondary | ICD-10-CM | POA: Diagnosis not present

## 2021-09-05 LAB — CBC
HCT: 37.5 % — ABNORMAL LOW (ref 39.0–52.0)
Hemoglobin: 12.6 g/dL — ABNORMAL LOW (ref 13.0–17.0)
MCH: 28.8 pg (ref 26.0–34.0)
MCHC: 33.6 g/dL (ref 30.0–36.0)
MCV: 85.6 fL (ref 80.0–100.0)
Platelets: 199 10*3/uL (ref 150–400)
RBC: 4.38 MIL/uL (ref 4.22–5.81)
RDW: 12.6 % (ref 11.5–15.5)
WBC: 17.7 10*3/uL — ABNORMAL HIGH (ref 4.0–10.5)
nRBC: 0 % (ref 0.0–0.2)

## 2021-09-05 LAB — BASIC METABOLIC PANEL
Anion gap: 6 (ref 5–15)
BUN: 21 mg/dL (ref 8–23)
CO2: 24 mmol/L (ref 22–32)
Calcium: 8.5 mg/dL — ABNORMAL LOW (ref 8.9–10.3)
Chloride: 107 mmol/L (ref 98–111)
Creatinine, Ser: 1.16 mg/dL (ref 0.61–1.24)
GFR, Estimated: >60 mL/min (ref >60)
Glucose, Bld: 245 mg/dL — ABNORMAL HIGH (ref 70–99)
Potassium: 4.4 mmol/L (ref 3.5–5.1)
Sodium: 137 mmol/L (ref 135–145)

## 2021-09-05 MED ORDER — ASPIRIN 81 MG PO CHEW: 81.0000 mg | CHEWABLE_TABLET | Freq: Two times a day (BID) | ORAL | 0 refills | Status: DC

## 2021-09-05 MED ORDER — OXYCODONE HCL 5 MG PO TABS: 5.0000 mg | ORAL_TABLET | Freq: Four times a day (QID) | ORAL | 0 refills | Status: DC | PRN

## 2021-09-05 MED ORDER — OXYCODONE HCL 5 MG PO TABS: 5.0000 mg | ORAL_TABLET | ORAL | 0 refills | Status: DC | PRN

## 2021-09-05 MED ORDER — METHOCARBAMOL 500 MG PO TABS: 500.0000 mg | ORAL_TABLET | Freq: Four times a day (QID) | ORAL | 1 refills | Status: DC | PRN

## 2021-09-05 NOTE — TOC Transition Note (Signed)
Transition of Care [facility]) - CM/SW Discharge Note   Patient Details  Name: Bryan Bradley MRN: [id number] Date of Birth: Oct 23, 1960  Transition of Care [facility]) CM/SW Contact:  Darleene Cleaver, LCSW Phone Number: 09/05/2021, 10:37 AM   Clinical Narrative:     Patient will be going home with home health through [facility], which was prearranged at MD office.  Per patient he has equipment at home and does not need anything else.  CSW signing off please reconsult with any other social work needs, home health agency has been notified of planned discharge.     Final next level of care: Home w Home Health Services Barriers to Discharge: Barriers Resolved   Patient Goals and CMS Choice Patient states their goals for this hospitalization and ongoing recovery are:: To return back home with home health. CMS Medicare.gov Compare Post Acute Care list provided to:: Patient Choice offered to / list presented to : Patient  Discharge Placement                       Discharge Plan and Services                          HH Arranged: PT [facility] Agency: [facility]        Social Determinants of Health (SDOH) Interventions     Readmission Risk Interventions     No data to display

## 2021-09-05 NOTE — Progress Notes (Signed)
Physical Therapy Treatment Patient Details Name: Bryan Bradley MRN: [id number] DOB: 04-Oct-1960 Today's Date: 09/05/2021   History of Present Illness pt is a 61yo male presenting s/p R-THa, AA on 09/04/21. PMH: HLD, GERD, pre-diabetes, R-rotator cuff repair 2005.    PT Comments    Pt progressing exceptionally well this am. Will see again to review HEP and pt should be ready for d/c later today.  Recommendations for follow up therapy are one component of a multi-disciplinary discharge planning process, led by the attending physician.  Recommendations may be updated based on patient status, additional functional criteria and insurance authorization.  Follow Up Recommendations  Follow physician's recommendations for discharge plan and follow up therapies     Assistance Recommended at Discharge Set up Supervision/Assistance  Patient can return home with the following Assistance with cooking/housework;Assist for transportation;Help with stairs or ramp for entrance (supervision for stairs)   Equipment Recommendations  None recommended by PT    Recommendations for Other Services       Precautions / Restrictions Precautions Precautions: Fall Restrictions Weight Bearing Restrictions: No Other Position/Activity Restrictions: WBAT     Mobility  Bed Mobility Overal bed mobility: Needs Assistance Bed Mobility: Supine to Sit     Supine to sit: Supervision     General bed mobility comments: for safety, no physical assist    Transfers Overall transfer level: Needs assistance Equipment used: Rolling walker (2 wheels) Transfers: Sit to/from Stand Sit to Stand: Supervision, Min guard           General transfer comment: cues for hand placement    Ambulation/Gait Ambulation/Gait assistance: Supervision Gait Distance (Feet): 200 Feet Assistive device: Rolling walker (2 wheels) Gait Pattern/deviations: Step-to pattern, Step-through pattern       General Gait Details: cues for  initial  sequence and progression to step through   Stairs Stairs: Yes Stairs assistance: Min guard   Number of Stairs: 3 General stair comments: cues for sequence, and RW position. good stability. no LOB   Wheelchair Mobility    Modified Rankin (Stroke Patients Only)       Balance                                            Cognition Arousal/Alertness: Awake/alert Behavior During Therapy: WFL for tasks assessed/performed Overall Cognitive Status: Within Functional Limits for tasks assessed                                          Exercises Total Joint Exercises Ankle Circles/Pumps: AROM, Both, 20 reps    General Comments        Pertinent Vitals/Pain Pain Assessment Pain Assessment: 0-10 Pain Score: 3  Pain Location: righthip Pain Descriptors / Indicators: Operative site guarding, Discomfort Pain Intervention(s): Limited activity within patient's tolerance, Monitored during session, Premedicated before session, Repositioned, Ice applied    Home Living                          Prior Function            PT Goals (current goals can now be found in the care plan section) Acute Rehab PT Goals Patient Stated Goal: To get back working, playing golf PT Goal Formulation: With patient Time  For Goal Achievement: 09/11/21 Potential to Achieve Goals: Good Progress towards PT goals: Progressing toward goals    Frequency    7X/week      PT Plan Current plan remains appropriate    Co-evaluation              AM-PAC PT "6 Clicks" Mobility   Outcome Measure  Help needed turning from your back to your side while in a flat bed without using bedrails?: None Help needed moving from lying on your back to sitting on the side of a flat bed without using bedrails?: None Help needed moving to and from a bed to a chair (including a wheelchair)?: A Little Help needed standing up from a chair using your arms (e.g.,  wheelchair or bedside chair)?: A Little Help needed to walk in hospital room?: A Little Help needed climbing 3-5 steps with a railing? : A Little 6 Click Score: 20    End of Session Equipment Utilized During Treatment: Gait belt Activity Tolerance: Patient tolerated treatment well;No increased pain Patient left: in chair;with call bell/phone within reach Nurse Communication: Mobility status PT Visit Diagnosis: Pain;Difficulty in walking, not elsewhere classified (R26.2) Pain - Right/Left: Right Pain - part of body: Hip     Time: 5427-0623 PT Time Calculation (min) (ACUTE ONLY): 17 min  Charges:  $Gait Training: 8-22 mins                     Delice Bison, PT  Acute Rehab Dept [facility]) [phone]  [facility] Weekend Pager (Saturday/Sunday only)  [phone]  09/05/2021    [facility] 09/05/2021, 11:36 AM

## 2021-09-05 NOTE — Discharge Instructions (Signed)

## 2021-09-05 NOTE — Progress Notes (Signed)
Pt stable at time of d/c instructions and education. Family at bedside at time of d/c instructions.

## 2021-09-05 NOTE — Plan of Care (Signed)
  Problem: Clinical Measurements: Goal: Postoperative complications will be avoided or minimized Outcome: Progressing   Problem: Pain Management: Goal: Pain level will decrease with appropriate interventions Outcome: Progressing   Problem: Clinical Measurements: Goal: Respiratory complications will improve Outcome: Progressing   Problem: Clinical Measurements: Goal: Cardiovascular complication will be avoided Outcome: Progressing   Problem: Elimination: Goal: Will not experience complications related to bowel motility Outcome: Progressing

## 2021-09-05 NOTE — Progress Notes (Signed)
Subjective: 1 Day Post-Op Procedure(s) (LRB): RIGHT TOTAL HIP ARTHROPLASTY ANTERIOR APPROACH (Right) Patient reports pain as moderate.    Objective: Vital signs in last 24 hours: Temp:  [97.5 F (36.4 C)-98.3 F (36.8 C)] 98 F (36.7 C) (09/02 0447) Pulse Rate:  [50-72] 72 (09/02 0447) Resp:  [12-19] 16 (09/02 0447) BP: (103-132)/(58-77) 115/70 (09/02 0447) SpO2:  [93 %-100 %] 96 % (09/02 0447)  Intake/Output from previous day: 09/01 0701 - 09/02 0700 In: 2545.5 [P.O.:600; I.V.:1745.5; IV Piggyback:200] Out: 2775 [Urine:2425; Blood:350] Intake/Output this shift: Total I/O In: 361.3 [P.O.:240; I.V.:121.3] Out: -   Recent Labs    09/05/21 0345  HGB 12.6*   Recent Labs    09/05/21 0345  WBC 17.7*  RBC 4.38  HCT 37.5*  PLT 199   Recent Labs    09/05/21 0345  NA 137  K 4.4  CL 107  CO2 24  BUN 21  CREATININE 1.16  GLUCOSE 245*  CALCIUM 8.5*   No results for input(s): "LABPT", "INR" in the last 72 hours.  Sensation intact distally Intact pulses distally Dorsiflexion/Plantar flexion intact Incision: scant drainage   Assessment/Plan: 1 Day Post-Op Procedure(s) (LRB): RIGHT TOTAL HIP ARTHROPLASTY ANTERIOR APPROACH (Right) Up with therapy Discharge home with home health      Kathryne Hitch 09/05/2021, 9:19 AM

## 2021-09-05 NOTE — Plan of Care (Signed)
Pt stable at this time with no needs. Pt dressing clean, dry, and intact.

## 2021-09-05 NOTE — Progress Notes (Signed)
Physical Therapy Treatment Patient Details Name: Bryan Bradley MRN: [id number] DOB: 02-08-1960 Today's Date: 09/05/2021   History of Present Illness pt is a 61yo male presenting s/p R-THa, AA on 09/04/21. PMH: HLD, GERD, pre-diabetes, R-rotator cuff repair 2005.    PT Comments    Pt meeting goals, tol incr gait distance, stairs and reviewed HEP without incr pain. Discussed stairs/technique up to second level  (pt's closet). Pt's wife reports he does not need to go up there right away. Reviewed activity progression and not increasing activity too quickly.    Recommendations for follow up therapy are one component of a multi-disciplinary discharge planning process, led by the attending physician.  Recommendations may be updated based on patient status, additional functional criteria and insurance authorization.  Follow Up Recommendations  Follow physician's recommendations for discharge plan and follow up therapies     Assistance Recommended at Discharge Set up Supervision/Assistance  Patient can return home with the following Assistance with cooking/housework;Assist for transportation;Help with stairs or ramp for entrance (supervision for stairs)   Equipment Recommendations  None recommended by PT    Recommendations for Other Services       Precautions / Restrictions Precautions Precautions: Fall Restrictions Weight Bearing Restrictions: No Other Position/Activity Restrictions: WBAT     Mobility  Bed Mobility Overal bed mobility: Needs Assistance Bed Mobility: Supine to Sit     Supine to sit: Supervision, Modified independent (Device/Increase time)     General bed mobility comments: no physical assist    Transfers Overall transfer level: Needs assistance Equipment used: Rolling walker (2 wheels) Transfers: Sit to/from Stand Sit to Stand: Supervision           General transfer comment: cues for hand placement    Ambulation/Gait Ambulation/Gait assistance:  Supervision Gait Distance (Feet): 360 Feet Assistive device: Rolling walker (2 wheels) Gait Pattern/deviations: Step-to pattern, Step-through pattern       General Gait Details: demonstrates incr wt shift to RLE, incr stride length   Stairs Stairs: Yes Stairs assistance: Min guard Stair Management: With walker, No rails Number of Stairs: 3 General stair comments: cues for sequence, and RW position. good stability. no LOB   Wheelchair Mobility    Modified Rankin (Stroke Patients Only)       Balance                                            Cognition Arousal/Alertness: Awake/alert Behavior During Therapy: WFL for tasks assessed/performed Overall Cognitive Status: Within Functional Limits for tasks assessed                                          Exercises Total Joint Exercises Ankle Circles/Pumps: AROM, Both, 20 reps Quad Sets: AROM, Both, 10 reps Heel Slides: AAROM, Right, 10 reps Hip ABduction/ADduction: AAROM, AROM, Right, 10 reps    General Comments        Pertinent Vitals/Pain Pain Assessment Pain Assessment: 0-10 Pain Score: 3  Pain Location: righthip Pain Descriptors / Indicators: Operative site guarding, Discomfort Pain Intervention(s): Limited activity within patient's tolerance, Monitored during session, Premedicated before session, Ice applied, Repositioned    Home Living  Prior Function            PT Goals (current goals can now be found in the care plan section) Acute Rehab PT Goals Patient Stated Goal: To get back working, playing golf PT Goal Formulation: With patient Time For Goal Achievement: 09/11/21 Potential to Achieve Goals: Good Progress towards PT goals: Progressing toward goals    Frequency    7X/week      PT Plan Current plan remains appropriate    Co-evaluation              AM-PAC PT "6 Clicks" Mobility   Outcome Measure  Help needed  turning from your back to your side while in a flat bed without using bedrails?: None Help needed moving from lying on your back to sitting on the side of a flat bed without using bedrails?: None Help needed moving to and from a bed to a chair (including a wheelchair)?: A Little Help needed standing up from a chair using your arms (e.g., wheelchair or bedside chair)?: A Little Help needed to walk in hospital room?: A Little Help needed climbing 3-5 steps with a railing? : A Little 6 Click Score: 20    End of Session Equipment Utilized During Treatment: Gait belt Activity Tolerance: Patient tolerated treatment well;No increased pain Patient left: in chair;with call bell/phone within reach;with family/visitor present Nurse Communication: Mobility status PT Visit Diagnosis: Pain;Difficulty in walking, not elsewhere classified (R26.2) Pain - Right/Left: Right Pain - part of body: Hip     Time: 0354-6568 PT Time Calculation (min) (ACUTE ONLY): 21 min  Charges:  $Gait Training: 8-22 mins                     Delice Bison, PT  Acute Rehab Dept [facility]) [phone]  [facility] Weekend Pager (Saturday/Sunday only)  [phone]  09/05/2021    [facility] 09/05/2021, 12:21 PM

## 2021-09-06 DIAGNOSIS — E785 Hyperlipidemia, unspecified: Secondary | ICD-10-CM | POA: Diagnosis not present

## 2021-09-06 DIAGNOSIS — Z7982 Long term (current) use of aspirin: Secondary | ICD-10-CM | POA: Diagnosis not present

## 2021-09-06 DIAGNOSIS — Z9181 History of falling: Secondary | ICD-10-CM | POA: Diagnosis not present

## 2021-09-06 DIAGNOSIS — Z471 Aftercare following joint replacement surgery: Secondary | ICD-10-CM | POA: Diagnosis not present

## 2021-09-06 DIAGNOSIS — Z96641 Presence of right artificial hip joint: Secondary | ICD-10-CM | POA: Diagnosis not present

## 2021-09-06 DIAGNOSIS — R7303 Prediabetes: Secondary | ICD-10-CM | POA: Diagnosis not present

## 2021-09-06 DIAGNOSIS — Z7984 Long term (current) use of oral hypoglycemic drugs: Secondary | ICD-10-CM | POA: Diagnosis not present

## 2021-09-06 DIAGNOSIS — K219 Gastro-esophageal reflux disease without esophagitis: Secondary | ICD-10-CM | POA: Diagnosis not present

## 2021-09-06 DIAGNOSIS — M722 Plantar fascial fibromatosis: Secondary | ICD-10-CM | POA: Diagnosis not present

## 2021-09-06 NOTE — Discharge Summary (Signed)
Patient ID: Bryan Bradley MRN: [id number] DOB/AGE: [birth year] 61 y.o.  Admit date: 09/04/2021 Discharge date: 09/05/21  Admission Diagnoses:  Principal Problem:   Unilateral primary osteoarthritis, right hip Active Problems:   Status post total replacement of right hip   Discharge Diagnoses:  Same  Past Medical History:  Diagnosis Date   Arthritis    GERD (gastroesophageal reflux disease)    Hyperlipidemia    Pre-diabetes     Surgeries: Procedure(s): RIGHT TOTAL HIP ARTHROPLASTY ANTERIOR APPROACH on 09/04/2021   Consultants:   Discharged Condition: Improved  Hospital Course: DWIGHT ADAMCZAK is an 61 y.o. male who was admitted 09/04/2021 for operative treatment ofUnilateral primary osteoarthritis, right hip. Patient has severe unremitting pain that affects sleep, daily activities, and work/hobbies. After pre-op clearance the patient was taken to the operating room on 09/04/2021 and underwent  Procedure(s): RIGHT TOTAL HIP ARTHROPLASTY ANTERIOR APPROACH.    Patient was given perioperative antibiotics:  Anti-infectives (From admission, onward)    Start     Dose/Rate Route Frequency Ordered Stop   09/04/21 1500  ceFAZolin (ANCEF) IVPB 1 g/50 mL premix        1 g 100 mL/hr over 30 Minutes Intravenous Every 6 hours 09/04/21 1145 09/04/21 2038   09/04/21 0645  ceFAZolin (ANCEF) IVPB 2g/100 mL premix        2 g 200 mL/hr over 30 Minutes Intravenous On call to O.R. 09/04/21 5956 09/05/21 1021        Patient was given sequential compression devices, early ambulation, and chemoprophylaxis to prevent DVT.  Patient benefited maximally from hospital stay and there were no complications.    Recent vital signs: No data found.   Recent laboratory studies:  Recent Labs    09/05/21 0345  WBC 17.7*  HGB 12.6*  HCT 37.5*  PLT 199  NA 137  K 4.4  CL 107  CO2 24  BUN 21  CREATININE 1.16  GLUCOSE 245*  CALCIUM 8.5*     Discharge Medications:   Allergies as of 09/05/2021        Reactions   Rozerem [ramelteon] Other (See Comments)   Felt bad, cloudy thoughts   Zoloft [sertraline] Other (See Comments)   Felt bad        Medication List     TAKE these medications    atorvastatin 10 MG tablet Commonly known as: LIPITOR Take 10 mg by mouth daily.   b complex vitamins capsule Take 1 capsule by mouth daily.   cholecalciferol 25 MCG (1000 UNIT) tablet Commonly known as: VITAMIN D3 Take 1,000 Units by mouth daily.   glimepiride 1 MG tablet Commonly known as: AMARYL Take 1 mg by mouth every morning.   loratadine 10 MG tablet Commonly known as: CLARITIN Take 10 mg by mouth daily.   magnesium oxide 400 (240 Mg) MG tablet Commonly known as: MAG-OX Take 400 mg by mouth daily.   metFORMIN 500 MG 24 hr tablet Commonly known as: GLUCOPHAGE-XR Take 1,500 mg by mouth daily.   omeprazole 40 MG capsule Commonly known as: PRILOSEC Take 40 mg by mouth daily as needed (acid reflux).   Testosterone 20.25 MG/ACT (1.62%) Gel Apply 2 Pump topically daily.   Turmeric 500 MG Caps Take by mouth.        Diagnostic Studies: DG Pelvis Portable  Result Date: 09/04/2021 CLINICAL DATA:  Status post right hip arthroplasty. EXAM: PORTABLE PELVIS 1-2 VIEWS COMPARISON:  Intraoperative imaging earlier today. FINDINGS: Frontal projection of the pelvis demonstrates normal alignment  of a right total hip arthroplasty. No surrounding fracture or abnormal lucency identified. The bony pelvis is intact. IMPRESSION: Normal alignment of right total hip arthroplasty. Electronically Signed   By: Irish Lack M.D.   On: 09/04/2021 11:23   DG HIP UNILAT WITH PELVIS 1V LEFT  Result Date: 09/04/2021 CLINICAL DATA:  Status post right hip replacement. EXAM: DG HIP (WITH OR WITHOUT PELVIS) 1V*L*; DG C-ARM 1-60 MIN-NO REPORT Radiation exposure index: 2.3976 mGy. COMPARISON:  None Available. FINDINGS: Three intraoperative fluoroscopic images were obtained of the right hip. The right  acetabular and femoral components are well situated. IMPRESSION: Fluoroscopic guidance provided during right total hip arthroplasty. Electronically Signed   By: Lupita Raider M.D.   On: 09/04/2021 10:06   DG C-Arm 1-60 Min-No Report  Result Date: 09/04/2021 Fluoroscopy was utilized by the requesting physician.  No radiographic interpretation.    Disposition: Discharge disposition: 01-Home or Self Care          Follow-up Information     Kathryne Hitch, MD Follow up in 2 week(s).   Specialty: Orthopedic Surgery Contact information: [address] [place] [address] [phone]         [facility] Follow up.   Specialty: Home Health Services Why: [facility] will reach out to you to schedule the first visit. Contact information: [address] [place] [address] [phone]                  Signed: Kathryne Hitch 09/06/2021, 12:57 PM

## 2021-09-08 ENCOUNTER — Encounter (HOSPITAL_COMMUNITY): Payer: Self-pay | Admitting: Orthopaedic Surgery

## 2021-09-09 DIAGNOSIS — Z7982 Long term (current) use of aspirin: Secondary | ICD-10-CM | POA: Diagnosis not present

## 2021-09-09 DIAGNOSIS — Z471 Aftercare following joint replacement surgery: Secondary | ICD-10-CM | POA: Diagnosis not present

## 2021-09-09 DIAGNOSIS — M722 Plantar fascial fibromatosis: Secondary | ICD-10-CM | POA: Diagnosis not present

## 2021-09-09 DIAGNOSIS — R7303 Prediabetes: Secondary | ICD-10-CM | POA: Diagnosis not present

## 2021-09-09 DIAGNOSIS — Z7984 Long term (current) use of oral hypoglycemic drugs: Secondary | ICD-10-CM | POA: Diagnosis not present

## 2021-09-09 DIAGNOSIS — Z96641 Presence of right artificial hip joint: Secondary | ICD-10-CM | POA: Diagnosis not present

## 2021-09-09 DIAGNOSIS — Z9181 History of falling: Secondary | ICD-10-CM | POA: Diagnosis not present

## 2021-09-09 DIAGNOSIS — E785 Hyperlipidemia, unspecified: Secondary | ICD-10-CM | POA: Diagnosis not present

## 2021-09-09 DIAGNOSIS — K219 Gastro-esophageal reflux disease without esophagitis: Secondary | ICD-10-CM | POA: Diagnosis not present

## 2021-09-10 ENCOUNTER — Other Ambulatory Visit: Payer: Self-pay | Admitting: Orthopaedic Surgery

## 2021-09-10 MED ORDER — TEMAZEPAM 15 MG PO CAPS
15.0000 mg | ORAL_CAPSULE | Freq: Every evening | ORAL | 0 refills | Status: DC | PRN
Start: 2021-09-10 — End: 2021-09-23

## 2021-09-11 DIAGNOSIS — Z7982 Long term (current) use of aspirin: Secondary | ICD-10-CM | POA: Diagnosis not present

## 2021-09-11 DIAGNOSIS — Z471 Aftercare following joint replacement surgery: Secondary | ICD-10-CM | POA: Diagnosis not present

## 2021-09-11 DIAGNOSIS — K219 Gastro-esophageal reflux disease without esophagitis: Secondary | ICD-10-CM | POA: Diagnosis not present

## 2021-09-11 DIAGNOSIS — R7303 Prediabetes: Secondary | ICD-10-CM | POA: Diagnosis not present

## 2021-09-11 DIAGNOSIS — Z7984 Long term (current) use of oral hypoglycemic drugs: Secondary | ICD-10-CM | POA: Diagnosis not present

## 2021-09-11 DIAGNOSIS — E785 Hyperlipidemia, unspecified: Secondary | ICD-10-CM | POA: Diagnosis not present

## 2021-09-11 DIAGNOSIS — Z9181 History of falling: Secondary | ICD-10-CM | POA: Diagnosis not present

## 2021-09-11 DIAGNOSIS — Z96641 Presence of right artificial hip joint: Secondary | ICD-10-CM | POA: Diagnosis not present

## 2021-09-11 DIAGNOSIS — M722 Plantar fascial fibromatosis: Secondary | ICD-10-CM | POA: Diagnosis not present

## 2021-09-15 ENCOUNTER — Telehealth: Payer: Self-pay | Admitting: Orthopaedic Surgery

## 2021-09-15 NOTE — Telephone Encounter (Signed)
Bryan Bradley [facility]) called from [facility] with an [place]. Pt will miss therapy visit today and will discharged tomorrow. Pt has to take wife to dr appt. If any question call Bryan Bradley at [phone].

## 2021-09-17 ENCOUNTER — Encounter: Payer: Self-pay | Admitting: Orthopaedic Surgery

## 2021-09-17 ENCOUNTER — Ambulatory Visit (INDEPENDENT_AMBULATORY_CARE_PROVIDER_SITE_OTHER): Payer: BC Managed Care – PPO | Admitting: Orthopaedic Surgery

## 2021-09-17 DIAGNOSIS — Z96641 Presence of right artificial hip joint: Secondary | ICD-10-CM

## 2021-09-17 NOTE — Progress Notes (Signed)
Bryan Bradley is here today for his first postoperative visit status post a right total hip arthroplasty performed 2 weeks ago tomorrow.  He actually drove in today.  His wife begins her cancer treatments only tomorrow.  He is doing well otherwise.  He has done some working out in the gym and really knows his limitations.  On examination his right hip incision looks good.  His calf is soft.  The staples been removed and Steri-Strips applied.  He understands he can get these wet in the shower.  We talked in length in detail about the things to try not to try.  He will continue to increase his activities as comfort allows.  I aspirated a minimal seroma from his hip but it was minimal.  We will see him back in 4 weeks to see how he is doing overall but no x-rays are needed.  He knows that he can text me or call me if there is issues before then.

## 2021-09-23 ENCOUNTER — Other Ambulatory Visit: Payer: Self-pay | Admitting: Orthopaedic Surgery

## 2021-09-23 MED ORDER — ZOLPIDEM TARTRATE 5 MG PO TABS: 5.0000 mg | ORAL_TABLET | Freq: Every evening | ORAL | 0 refills | Status: DC | PRN

## 2021-09-23 MED ORDER — OXYCODONE HCL 5 MG PO TABS: 5.0000 mg | ORAL_TABLET | Freq: Four times a day (QID) | ORAL | 0 refills | Status: DC | PRN

## 2021-10-06 ENCOUNTER — Other Ambulatory Visit: Payer: Self-pay | Admitting: Orthopaedic Surgery

## 2021-10-06 MED ORDER — OXYCODONE HCL 5 MG PO TABS: 5.0000 mg | ORAL_TABLET | Freq: Three times a day (TID) | ORAL | 0 refills | Status: DC | PRN

## 2021-10-16 DIAGNOSIS — Z0000 Encounter for general adult medical examination without abnormal findings: Secondary | ICD-10-CM | POA: Diagnosis not present

## 2021-10-16 DIAGNOSIS — E119 Type 2 diabetes mellitus without complications: Secondary | ICD-10-CM | POA: Diagnosis not present

## 2021-10-16 DIAGNOSIS — E7800 Pure hypercholesterolemia, unspecified: Secondary | ICD-10-CM | POA: Diagnosis not present

## 2021-10-21 ENCOUNTER — Encounter: Payer: Self-pay | Admitting: Orthopaedic Surgery

## 2021-10-21 ENCOUNTER — Ambulatory Visit (INDEPENDENT_AMBULATORY_CARE_PROVIDER_SITE_OTHER): Payer: BC Managed Care – PPO | Admitting: Orthopaedic Surgery

## 2021-10-21 DIAGNOSIS — Z96641 Presence of right artificial hip joint: Secondary | ICD-10-CM

## 2021-10-21 MED ORDER — OXYCODONE HCL 5 MG PO TABS: 5.0000 mg | ORAL_TABLET | Freq: Three times a day (TID) | ORAL | 0 refills | Status: DC | PRN

## 2021-10-21 NOTE — Progress Notes (Signed)
Bryan Bradley is now 6 weeks status post a right total hip arthroplasty.  He is working with Physiological scientist at home and still has to take occasional oxycodone but only at night.  He is working again.  He is ambulate with a cane here and there to take off pressure from his right hip.  He does report his motion is getting a little better.  His personal trainer is working on stretching of the lower extremities but no strengthening yet.  On exam his right hip incision looks good.  There is no seroma.  He still has stiff range of motion of his right hip but it is improving.  He said he put his shoes and socks on easier today.  I will refill his oxycodone since he is taking this sparingly.  The next emanates see him back several 3 months.  At that visit we will have a standing low AP pelvis and lateral of his right operative hip.  All questions and concerns were answered and addressed.

## 2021-11-04 DIAGNOSIS — M9903 Segmental and somatic dysfunction of lumbar region: Secondary | ICD-10-CM | POA: Diagnosis not present

## 2021-11-04 DIAGNOSIS — M9905 Segmental and somatic dysfunction of pelvic region: Secondary | ICD-10-CM | POA: Diagnosis not present

## 2021-11-04 DIAGNOSIS — Z96641 Presence of right artificial hip joint: Secondary | ICD-10-CM | POA: Diagnosis not present

## 2021-11-04 DIAGNOSIS — M5116 Intervertebral disc disorders with radiculopathy, lumbar region: Secondary | ICD-10-CM | POA: Diagnosis not present

## 2021-11-05 ENCOUNTER — Other Ambulatory Visit: Payer: Self-pay | Admitting: Orthopaedic Surgery

## 2021-11-05 MED ORDER — OXYCODONE HCL 5 MG PO TABS: 5.0000 mg | ORAL_TABLET | Freq: Two times a day (BID) | ORAL | 0 refills | Status: DC | PRN

## 2021-12-09 ENCOUNTER — Other Ambulatory Visit: Payer: Self-pay

## 2021-12-09 DIAGNOSIS — M9902 Segmental and somatic dysfunction of thoracic region: Secondary | ICD-10-CM | POA: Diagnosis not present

## 2021-12-09 DIAGNOSIS — M5386 Other specified dorsopathies, lumbar region: Secondary | ICD-10-CM | POA: Diagnosis not present

## 2021-12-09 DIAGNOSIS — Z96641 Presence of right artificial hip joint: Secondary | ICD-10-CM

## 2021-12-09 DIAGNOSIS — M9903 Segmental and somatic dysfunction of lumbar region: Secondary | ICD-10-CM | POA: Diagnosis not present

## 2021-12-09 DIAGNOSIS — M7601 Gluteal tendinitis, right hip: Secondary | ICD-10-CM | POA: Diagnosis not present

## 2021-12-09 DIAGNOSIS — M9904 Segmental and somatic dysfunction of sacral region: Secondary | ICD-10-CM | POA: Diagnosis not present

## 2021-12-15 ENCOUNTER — Encounter: Payer: Self-pay | Admitting: Gastroenterology

## 2021-12-16 DIAGNOSIS — M9902 Segmental and somatic dysfunction of thoracic region: Secondary | ICD-10-CM | POA: Diagnosis not present

## 2021-12-16 DIAGNOSIS — M5386 Other specified dorsopathies, lumbar region: Secondary | ICD-10-CM | POA: Diagnosis not present

## 2021-12-16 DIAGNOSIS — M9903 Segmental and somatic dysfunction of lumbar region: Secondary | ICD-10-CM | POA: Diagnosis not present

## 2021-12-16 DIAGNOSIS — M9904 Segmental and somatic dysfunction of sacral region: Secondary | ICD-10-CM | POA: Diagnosis not present

## 2021-12-18 DIAGNOSIS — M25551 Pain in right hip: Secondary | ICD-10-CM | POA: Diagnosis not present

## 2021-12-18 DIAGNOSIS — M25651 Stiffness of right hip, not elsewhere classified: Secondary | ICD-10-CM | POA: Diagnosis not present

## 2021-12-18 DIAGNOSIS — M6281 Muscle weakness (generalized): Secondary | ICD-10-CM | POA: Diagnosis not present

## 2021-12-23 DIAGNOSIS — M9904 Segmental and somatic dysfunction of sacral region: Secondary | ICD-10-CM | POA: Diagnosis not present

## 2021-12-23 DIAGNOSIS — M9903 Segmental and somatic dysfunction of lumbar region: Secondary | ICD-10-CM | POA: Diagnosis not present

## 2021-12-23 DIAGNOSIS — M9902 Segmental and somatic dysfunction of thoracic region: Secondary | ICD-10-CM | POA: Diagnosis not present

## 2021-12-23 DIAGNOSIS — M25551 Pain in right hip: Secondary | ICD-10-CM | POA: Diagnosis not present

## 2021-12-23 DIAGNOSIS — M6281 Muscle weakness (generalized): Secondary | ICD-10-CM | POA: Diagnosis not present

## 2021-12-23 DIAGNOSIS — M5386 Other specified dorsopathies, lumbar region: Secondary | ICD-10-CM | POA: Diagnosis not present

## 2021-12-23 DIAGNOSIS — M25651 Stiffness of right hip, not elsewhere classified: Secondary | ICD-10-CM | POA: Diagnosis not present

## 2021-12-30 DIAGNOSIS — M9904 Segmental and somatic dysfunction of sacral region: Secondary | ICD-10-CM | POA: Diagnosis not present

## 2021-12-30 DIAGNOSIS — M6281 Muscle weakness (generalized): Secondary | ICD-10-CM | POA: Diagnosis not present

## 2021-12-30 DIAGNOSIS — M25551 Pain in right hip: Secondary | ICD-10-CM | POA: Diagnosis not present

## 2021-12-30 DIAGNOSIS — M25651 Stiffness of right hip, not elsewhere classified: Secondary | ICD-10-CM | POA: Diagnosis not present

## 2021-12-30 DIAGNOSIS — M9903 Segmental and somatic dysfunction of lumbar region: Secondary | ICD-10-CM | POA: Diagnosis not present

## 2021-12-30 DIAGNOSIS — M5386 Other specified dorsopathies, lumbar region: Secondary | ICD-10-CM | POA: Diagnosis not present

## 2021-12-30 DIAGNOSIS — M9902 Segmental and somatic dysfunction of thoracic region: Secondary | ICD-10-CM | POA: Diagnosis not present

## 2022-01-06 DIAGNOSIS — M25651 Stiffness of right hip, not elsewhere classified: Secondary | ICD-10-CM | POA: Diagnosis not present

## 2022-01-06 DIAGNOSIS — M25551 Pain in right hip: Secondary | ICD-10-CM | POA: Diagnosis not present

## 2022-01-06 DIAGNOSIS — M6281 Muscle weakness (generalized): Secondary | ICD-10-CM | POA: Diagnosis not present

## 2022-01-08 DIAGNOSIS — M5386 Other specified dorsopathies, lumbar region: Secondary | ICD-10-CM | POA: Diagnosis not present

## 2022-01-08 DIAGNOSIS — M9902 Segmental and somatic dysfunction of thoracic region: Secondary | ICD-10-CM | POA: Diagnosis not present

## 2022-01-08 DIAGNOSIS — M9904 Segmental and somatic dysfunction of sacral region: Secondary | ICD-10-CM | POA: Diagnosis not present

## 2022-01-08 DIAGNOSIS — M9903 Segmental and somatic dysfunction of lumbar region: Secondary | ICD-10-CM | POA: Diagnosis not present

## 2022-01-13 DIAGNOSIS — M25651 Stiffness of right hip, not elsewhere classified: Secondary | ICD-10-CM | POA: Diagnosis not present

## 2022-01-13 DIAGNOSIS — M25551 Pain in right hip: Secondary | ICD-10-CM | POA: Diagnosis not present

## 2022-01-13 DIAGNOSIS — M6281 Muscle weakness (generalized): Secondary | ICD-10-CM | POA: Diagnosis not present

## 2022-01-15 DIAGNOSIS — M9904 Segmental and somatic dysfunction of sacral region: Secondary | ICD-10-CM | POA: Diagnosis not present

## 2022-01-15 DIAGNOSIS — M9903 Segmental and somatic dysfunction of lumbar region: Secondary | ICD-10-CM | POA: Diagnosis not present

## 2022-01-15 DIAGNOSIS — M9902 Segmental and somatic dysfunction of thoracic region: Secondary | ICD-10-CM | POA: Diagnosis not present

## 2022-01-15 DIAGNOSIS — M5386 Other specified dorsopathies, lumbar region: Secondary | ICD-10-CM | POA: Diagnosis not present

## 2022-01-22 DIAGNOSIS — M9902 Segmental and somatic dysfunction of thoracic region: Secondary | ICD-10-CM | POA: Diagnosis not present

## 2022-01-22 DIAGNOSIS — M9903 Segmental and somatic dysfunction of lumbar region: Secondary | ICD-10-CM | POA: Diagnosis not present

## 2022-01-22 DIAGNOSIS — M9904 Segmental and somatic dysfunction of sacral region: Secondary | ICD-10-CM | POA: Diagnosis not present

## 2022-01-22 DIAGNOSIS — M5386 Other specified dorsopathies, lumbar region: Secondary | ICD-10-CM | POA: Diagnosis not present

## 2022-01-28 DIAGNOSIS — M25551 Pain in right hip: Secondary | ICD-10-CM | POA: Diagnosis not present

## 2022-01-29 DIAGNOSIS — M9902 Segmental and somatic dysfunction of thoracic region: Secondary | ICD-10-CM | POA: Diagnosis not present

## 2022-01-29 DIAGNOSIS — M9903 Segmental and somatic dysfunction of lumbar region: Secondary | ICD-10-CM | POA: Diagnosis not present

## 2022-01-29 DIAGNOSIS — M9904 Segmental and somatic dysfunction of sacral region: Secondary | ICD-10-CM | POA: Diagnosis not present

## 2022-01-29 DIAGNOSIS — M5386 Other specified dorsopathies, lumbar region: Secondary | ICD-10-CM | POA: Diagnosis not present

## 2022-02-10 DIAGNOSIS — M25551 Pain in right hip: Secondary | ICD-10-CM | POA: Diagnosis not present

## 2022-02-26 DIAGNOSIS — M25551 Pain in right hip: Secondary | ICD-10-CM | POA: Diagnosis not present

## 2022-03-11 ENCOUNTER — Encounter: Payer: Self-pay | Admitting: Radiology

## 2022-03-20 DIAGNOSIS — M9902 Segmental and somatic dysfunction of thoracic region: Secondary | ICD-10-CM | POA: Diagnosis not present

## 2022-03-20 DIAGNOSIS — M9903 Segmental and somatic dysfunction of lumbar region: Secondary | ICD-10-CM | POA: Diagnosis not present

## 2022-03-20 DIAGNOSIS — M5386 Other specified dorsopathies, lumbar region: Secondary | ICD-10-CM | POA: Diagnosis not present

## 2022-03-20 DIAGNOSIS — M9904 Segmental and somatic dysfunction of sacral region: Secondary | ICD-10-CM | POA: Diagnosis not present

## 2022-03-27 DIAGNOSIS — M9903 Segmental and somatic dysfunction of lumbar region: Secondary | ICD-10-CM | POA: Diagnosis not present

## 2022-03-27 DIAGNOSIS — M9902 Segmental and somatic dysfunction of thoracic region: Secondary | ICD-10-CM | POA: Diagnosis not present

## 2022-03-27 DIAGNOSIS — M9904 Segmental and somatic dysfunction of sacral region: Secondary | ICD-10-CM | POA: Diagnosis not present

## 2022-03-27 DIAGNOSIS — M5386 Other specified dorsopathies, lumbar region: Secondary | ICD-10-CM | POA: Diagnosis not present

## 2022-04-06 DIAGNOSIS — M5386 Other specified dorsopathies, lumbar region: Secondary | ICD-10-CM | POA: Diagnosis not present

## 2022-04-06 DIAGNOSIS — M9903 Segmental and somatic dysfunction of lumbar region: Secondary | ICD-10-CM | POA: Diagnosis not present

## 2022-04-06 DIAGNOSIS — M9904 Segmental and somatic dysfunction of sacral region: Secondary | ICD-10-CM | POA: Diagnosis not present

## 2022-04-06 DIAGNOSIS — M9902 Segmental and somatic dysfunction of thoracic region: Secondary | ICD-10-CM | POA: Diagnosis not present

## 2022-04-16 DIAGNOSIS — E7800 Pure hypercholesterolemia, unspecified: Secondary | ICD-10-CM | POA: Diagnosis not present

## 2022-04-16 DIAGNOSIS — E119 Type 2 diabetes mellitus without complications: Secondary | ICD-10-CM | POA: Diagnosis not present

## 2022-04-16 DIAGNOSIS — E291 Testicular hypofunction: Secondary | ICD-10-CM | POA: Diagnosis not present

## 2022-04-16 DIAGNOSIS — N529 Male erectile dysfunction, unspecified: Secondary | ICD-10-CM | POA: Diagnosis not present

## 2022-08-14 DIAGNOSIS — U071 COVID-19: Secondary | ICD-10-CM | POA: Diagnosis not present

## 2022-08-14 DIAGNOSIS — R0981 Nasal congestion: Secondary | ICD-10-CM | POA: Diagnosis not present

## 2022-08-14 DIAGNOSIS — H65191 Other acute nonsuppurative otitis media, right ear: Secondary | ICD-10-CM | POA: Diagnosis not present

## 2022-08-25 DIAGNOSIS — M5386 Other specified dorsopathies, lumbar region: Secondary | ICD-10-CM | POA: Diagnosis not present

## 2022-08-25 DIAGNOSIS — M9904 Segmental and somatic dysfunction of sacral region: Secondary | ICD-10-CM | POA: Diagnosis not present

## 2022-08-25 DIAGNOSIS — M9902 Segmental and somatic dysfunction of thoracic region: Secondary | ICD-10-CM | POA: Diagnosis not present

## 2022-08-25 DIAGNOSIS — M9903 Segmental and somatic dysfunction of lumbar region: Secondary | ICD-10-CM | POA: Diagnosis not present

## 2022-10-20 DIAGNOSIS — Z125 Encounter for screening for malignant neoplasm of prostate: Secondary | ICD-10-CM | POA: Diagnosis not present

## 2022-10-20 DIAGNOSIS — Z23 Encounter for immunization: Secondary | ICD-10-CM | POA: Diagnosis not present

## 2022-10-20 DIAGNOSIS — E118 Type 2 diabetes mellitus with unspecified complications: Secondary | ICD-10-CM | POA: Diagnosis not present

## 2022-10-20 DIAGNOSIS — E7800 Pure hypercholesterolemia, unspecified: Secondary | ICD-10-CM | POA: Diagnosis not present

## 2022-10-20 DIAGNOSIS — Z0000 Encounter for general adult medical examination without abnormal findings: Secondary | ICD-10-CM | POA: Diagnosis not present

## 2022-11-09 ENCOUNTER — Ambulatory Visit (AMBULATORY_SURGERY_CENTER): Payer: BC Managed Care – PPO

## 2022-11-09 VITALS — Ht 69.0 in | Wt 225.0 lb

## 2022-11-09 DIAGNOSIS — Z1211 Encounter for screening for malignant neoplasm of colon: Secondary | ICD-10-CM

## 2022-11-09 MED ORDER — NA SULFATE-K SULFATE-MG SULF 17.5-3.13-1.6 GM/177ML PO SOLN: 1.0000 | Freq: Once | ORAL | 0 refills | Status: AC

## 2022-11-09 NOTE — Progress Notes (Signed)

## 2022-11-26 ENCOUNTER — Encounter: Payer: Self-pay | Admitting: Family Medicine

## 2022-11-26 ENCOUNTER — Ambulatory Visit: Payer: BC Managed Care – PPO | Admitting: Family Medicine

## 2022-11-26 VITALS — BP 126/88 | Ht 69.0 in | Wt 225.0 lb

## 2022-11-26 DIAGNOSIS — M545 Low back pain, unspecified: Secondary | ICD-10-CM | POA: Diagnosis not present

## 2022-11-26 DIAGNOSIS — G8929 Other chronic pain: Secondary | ICD-10-CM | POA: Diagnosis not present

## 2022-11-26 NOTE — Progress Notes (Signed)
PCP: Debroah Loop, DO  Chief Complaint: Right sided back pain Subjective:   HPI: Patient is a 62 y.o. male here for right-sided lower back pain.  Patient states that the pain has been going on for approximately 5 years now.  Patient states that originally was told that he had hip tightness/issues and that his hip was causing his back pain.  Patient subsequently had a hip replacement a year ago and states that he was rigorous with his PT and despite doing his PT has recommended he is still having some right-sided lower back pain.  Patient states that the pain occurs whenever he is twisting or side bending.  Patient states that the pain is all located on the right side, patient notes some release 20 twist to the left.  Patient is currently an avid golfer and would like to continue playing golf but states that whenever he twists to his right he has pain.  Patient has been trying stretching but does note that he used to get regular deep tissue massages but stopped doing that approximately 5 years ago.  Past Medical History:  Diagnosis Date   Arthritis    Diabetes mellitus without complication (HCC)    GERD (gastroesophageal reflux disease)    Hyperlipidemia    Pre-diabetes     Current Outpatient Medications on File Prior to Visit  Medication Sig Dispense Refill   atorvastatin (LIPITOR) 10 MG tablet Take 10 mg by mouth daily.     loratadine (CLARITIN) 10 MG tablet Take 10 mg by mouth daily.     metFORMIN (GLUCOPHAGE-XR) 500 MG 24 hr tablet Take 1,500 mg by mouth daily.     Testosterone 20.25 MG/ACT (1.62%) GEL Apply 2 Pump topically daily.     No current facility-administered medications on file prior to visit.    Past Surgical History:  Procedure Laterality Date   CYST REMOVAL NECK  1969   ROTATOR CUFF REPAIR  2005   right   TOTAL HIP ARTHROPLASTY Right 09/04/2021   Procedure: RIGHT TOTAL HIP ARTHROPLASTY ANTERIOR APPROACH;  Surgeon: Kathryne Hitch, MD;  Location: [facility];   Service: Orthopedics;  Laterality: Right;    Allergies  Allergen Reactions   Rozerem [Ramelteon] Other (See Comments)    Felt bad, cloudy thoughts   Zoloft [Sertraline] Other (See Comments)    Felt bad    BP 126/88   Ht 5\' 9"  (1.753 m)   Wt 225 lb (102.1 kg)   BMI 33.23 kg/m       No data to display              No data to display              Objective:  Physical Exam:  Gen: NAD, comfortable in exam room  Lumbar spine:  - Inspection: no gross deformity or scoliosis; no swelling or ecchymosis. No skin changes - Palpation: No TTP over the spinous processes, or SI joints b/l, there is tenderness to palpation over the paraspinal area at approximately L3, this is located more laterally near the external obliques - ROM: full active ROM of the lumbar spine in flexion and extension without pain - Strength: 5/5 strength of lower extremity in L4-S1 nerve root distributions b/l  *L1/L2: Hip Flexion & Abduction  *L3/L4: Knee Extension  *L4/L5: Ankle Dorsiflexion  *L5: Great Toe Extension  *S1: Ankle Plantar Flexion - Neuro: sensation intact in the L4-S1 nerve root distribution b/l, 2+ L4 and S1 reflexes - Provocative Testing: Negative straight  leg raise, Modified Slump Test  Hip, Right:  Passive Log Roll equivalent b/l without restriction. ROM full in all directions; Strength 5/5 in IR/ER/Flex/Ext/Abd/Add. Pelvic alignment unremarkable to inspection and palpation. Non-antalgic gait without trendelenburg / unsteadiness. Greater trochanter without tenderness to palpation. No tenderness over piriformis. No SI joint tenderness and normal minimal SI movement.  Provocative Testing:    - FABER/FADIR test: NEG      Assessment & Plan:  1. 1. Chronic right-sided low back pain, unspecified whether sciatica present -At this time, patient's back pain does appear to be more muscular in nature.  Patient has not had relief from previous steroid injections and less likely patient's  symptoms are coming from the spine.  Will go ahead and have patient referred to OMT sports medicine doctor for some manipulation, will also recommend patient resume his deep tissue massages.  If both these things do not have any significant improvement, will have patient follow-up with back surgeon for second opinion.  Patient is understanding and agreeable with plan. - AMB referral to sports medicine    Brenton Grills MD, PGY-4  Sports Medicine Fellow [facility]

## 2022-11-26 NOTE — Patient Instructions (Signed)
Deep tissue massage option [email or web address]

## 2022-11-27 ENCOUNTER — Encounter: Payer: Self-pay | Admitting: Family Medicine

## 2022-11-27 NOTE — Progress Notes (Signed)
[facility]: Attending Note: I have examined the patient, reviewed the chart, discussed the assessment and plan with the Sports Medicine Fellow. I agree with assessment and treatment plan as detailed in the Fellow's note. We discussed his chronic rigt sided lower back pain which is almost directly centered on the thoracolumbar fascia and the right internal oblique muscles. He has some mild ttp in the ileicoastalis muscles as well. Chronic muscle pain, unclear etiology as he has had multiple interventions with little improvement. Discussed option not used yet including OMT, deep tissue massage and Pilates. Interestingly he used to get regular deep tissue massage and stopped when his massage therapist retired. He does note that his issues started some time after that. I gave him info for Dr Terrilee Files for OMT, [employer] website for deep tissue massage and card for Pilates. He will follow up PRN.

## 2022-12-05 NOTE — Progress Notes (Unsigned)
[facility] [address] [place] [address] Phone: [phone] Subjective:   Bryan Bradley, am serving as a scribe for Dr. Antoine Primas.  I'm seeing this patient by the request  of:  Debroah Loop, DO  CC: back pain   OFB:PZWCHENIDP  Bryan Bradley is a 62 y.o. male coming in with complaint of lower right back pain. Patient can only thing he can think of is all of the golf he plays. Was told he needed a hip replacement patient had Hip replacement September of last year. Patient does not have any hip pain its in his right low back. Patient has seen so many doctors and been told they have no idea why he has pain or where it is coming from. Patient would like someone to help him so he doesn't have to feel older than he is.   Onset- 2 years  Location- low right back Character- dull aching Aggravating factors- twisting position, bending, night time,  Reliving factors- advil Therapies tried- PT, exercises, personal trainers, advil, back injections Severity- 6/10    Hip replacement on right side in 9/23  MRI lumbar 2021 MPRESSION: 1. Enlargement of an L4-5 central disc protrusion with mild-to-moderate bilateral lateral recess stenosis. 2. Regression of L3-4 disc protrusion without residual spinal stenosis. 3. Mild bilateral neural foraminal stenosis at L4-5 and L5-S1. 4. 1 cm left renal lesion which could reflect a complex cyst orsmall solid mass. Renal ultrasound is recommended for furtherevaluation.   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   Procedure: RIGHT TOTAL HIP ARTHROPLASTY ANTERIOR APPROACH;  Surgeon: Kathryne Hitch, MD;   Location: [facility];  Service: Orthopedics;  Laterality: Right;   Social History   Socioeconomic History   Marital status: Married    Spouse name: Not on file   Number of children: 3   Years of education: Not on file   Highest education level: Not on file  Occupational History   Not on file  Tobacco Use   Smoking status: Never   Smokeless tobacco: Current    Types: Chew  Vaping Use   Vaping status: Never Used  Substance and Sexual Activity   Alcohol use: Yes    Comment: rare   Drug use: No   Sexual activity: Not on file  Other Topics Concern   Not on file  Social History Narrative   Not on file   Social Determinants of Health   Financial Resource Strain: Not on file  Food Insecurity: Not on file  Transportation Needs: Not on file  Physical Activity: Not on file  Stress: Not on file  Social Connections: Not on file   Allergies  Allergen Reactions   Rozerem [Ramelteon] Other (See Comments)    Felt bad, cloudy thoughts   Zoloft [Sertraline] Other (See Comments)    Felt bad   Family History  Problem Relation Age of Onset   Lung cancer Father    Colon cancer Neg Hx    Stomach cancer Neg Hx    Colon polyps Neg Hx    Esophageal cancer Neg Hx    Rectal cancer Neg Hx  Current Outpatient Medications (Endocrine & Metabolic):    metFORMIN (GLUCOPHAGE-XR) 500 MG 24 hr tablet, Take 1,500 mg by mouth daily.   Testosterone 20.25 MG/ACT (1.62%) GEL, Apply 2 Pump topically daily.  Current Outpatient Medications (Cardiovascular):    atorvastatin (LIPITOR) 10 MG tablet, Take 10 mg by mouth daily.  Current Outpatient Medications (Respiratory):    loratadine (CLARITIN) 10 MG tablet, Take 10 mg by mouth daily.      Reviewed prior external information including notes and imaging from  primary care provider As well as notes that were available from care everywhere and other healthcare systems.  Past medical history, social, surgical and family history all reviewed in  electronic medical record.  No pertanent information unless stated regarding to the chief complaint.   Review of Systems:  No headache, visual changes, nausea, vomiting, diarrhea, constipation, dizziness, abdominal pain, skin rash, fevers, chills, night sweats, weight loss, swollen lymph nodes, body aches, joint swelling, chest pain, shortness of breath, mood changes. POSITIVE muscle aches  Objective  Blood pressure 124/70, pulse 70, height 5\' 9"  (1.753 m), weight 232 lb (105.2 kg), SpO2 90%.   General: No apparent distress alert and oriented x3 mood and affect normal, dressed appropriately.  HEENT: Pupils equal, extraocular movements intact  Respiratory: Patient's speak in full sentences and does not appear short of breath  Cardiovascular: No lower extremity edema, non tender, no erythema  Low back exam shows loss of lordosis noted.  Tightness noted in the right-sided paraspinal musculature mostly in the lumbar area.  The patient does have tightness noted with rotation to the right.  Osteopathic findings T9 extended rotated and side bent left L1 flexed rotated and side bent left L2 flexed rotated and side bent right Sacrum right on right    Impression and Recommendations:     The above documentation has been reviewed and is accurate and complete Judi Saa, DO

## 2022-12-06 ENCOUNTER — Encounter: Payer: Self-pay | Admitting: Family Medicine

## 2022-12-06 ENCOUNTER — Ambulatory Visit: Payer: BC Managed Care – PPO | Admitting: Family Medicine

## 2022-12-06 VITALS — BP 124/70 | HR 70 | Ht 69.0 in | Wt 232.0 lb

## 2022-12-06 DIAGNOSIS — M9903 Segmental and somatic dysfunction of lumbar region: Secondary | ICD-10-CM

## 2022-12-06 DIAGNOSIS — N281 Cyst of kidney, acquired: Secondary | ICD-10-CM | POA: Diagnosis not present

## 2022-12-06 DIAGNOSIS — M9902 Segmental and somatic dysfunction of thoracic region: Secondary | ICD-10-CM | POA: Diagnosis not present

## 2022-12-06 DIAGNOSIS — M533 Sacrococcygeal disorders, not elsewhere classified: Secondary | ICD-10-CM | POA: Insufficient documentation

## 2022-12-06 DIAGNOSIS — M9904 Segmental and somatic dysfunction of sacral region: Secondary | ICD-10-CM

## 2022-12-06 NOTE — Patient Instructions (Addendum)
Do prescribed exercises at least 3x a week Renal US  See you again in 7-8 weeks

## 2022-12-06 NOTE — Assessment & Plan Note (Signed)
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  Patient has had a central disc protrusion previously and an MRI in 2021.  Do feel that patient can respond relatively well though to osteopathic manipulation and did attend today.  We discussed core stability exercises and hip abductor exercises.  Hold on any type of medication but will consider the possibility of gabapentin and anti-inflammatories if needed.  Increase activity slowly.  Follow-up with me again in 6 to 8 weeks.

## 2022-12-06 NOTE — Assessment & Plan Note (Addendum)
Seen on MRI in 2021.  Will get a renal ultrasound to further evaluate to make sure no significant enlargement has happened.  Patient has had no hematuria.  No fevers chills or any abnormal weight loss

## 2022-12-07 ENCOUNTER — Ambulatory Visit
Admission: RE | Admit: 2022-12-07 | Discharge: 2022-12-07 | Disposition: A | Discharge status: Home / Self Care | Payer: BC Managed Care – PPO | Source: Ambulatory Visit | Attending: Family Medicine | Admitting: Family Medicine

## 2022-12-07 ENCOUNTER — Other Ambulatory Visit: Payer: BC Managed Care – PPO

## 2022-12-07 DIAGNOSIS — N289 Disorder of kidney and ureter, unspecified: Secondary | ICD-10-CM | POA: Diagnosis not present

## 2022-12-07 DIAGNOSIS — N281 Cyst of kidney, acquired: Secondary | ICD-10-CM

## 2022-12-10 ENCOUNTER — Encounter: Payer: BC Managed Care – PPO | Admitting: Gastroenterology

## 2022-12-21 DIAGNOSIS — H6693 Otitis media, unspecified, bilateral: Secondary | ICD-10-CM | POA: Diagnosis not present

## 2022-12-23 ENCOUNTER — Ambulatory Visit: Payer: BC Managed Care – PPO | Admitting: Sports Medicine

## 2022-12-27 ENCOUNTER — Encounter: Payer: BC Managed Care – PPO | Admitting: Gastroenterology

## 2023-01-14 DIAGNOSIS — M5386 Other specified dorsopathies, lumbar region: Secondary | ICD-10-CM | POA: Diagnosis not present

## 2023-01-14 DIAGNOSIS — M9904 Segmental and somatic dysfunction of sacral region: Secondary | ICD-10-CM | POA: Diagnosis not present

## 2023-01-14 DIAGNOSIS — M9903 Segmental and somatic dysfunction of lumbar region: Secondary | ICD-10-CM | POA: Diagnosis not present

## 2023-01-14 DIAGNOSIS — M9902 Segmental and somatic dysfunction of thoracic region: Secondary | ICD-10-CM | POA: Diagnosis not present

## 2023-02-01 ENCOUNTER — Encounter: Payer: Self-pay | Admitting: Gastroenterology

## 2023-02-01 DIAGNOSIS — S161XXA Strain of muscle, fascia and tendon at neck level, initial encounter: Secondary | ICD-10-CM | POA: Diagnosis not present

## 2023-02-01 DIAGNOSIS — S93401A Sprain of unspecified ligament of right ankle, initial encounter: Secondary | ICD-10-CM | POA: Diagnosis not present

## 2023-02-08 ENCOUNTER — Encounter: Payer: Self-pay | Admitting: Gastroenterology

## 2023-02-08 ENCOUNTER — Ambulatory Visit (AMBULATORY_SURGERY_CENTER): Payer: BC Managed Care – PPO | Admitting: Gastroenterology

## 2023-02-08 VITALS — BP 115/73 | HR 61 | Temp 98.3°F | Resp 13 | Ht 69.0 in | Wt 225.0 lb

## 2023-02-08 DIAGNOSIS — Z1211 Encounter for screening for malignant neoplasm of colon: Secondary | ICD-10-CM | POA: Diagnosis not present

## 2023-02-08 DIAGNOSIS — K5730 Diverticulosis of large intestine without perforation or abscess without bleeding: Secondary | ICD-10-CM | POA: Diagnosis not present

## 2023-02-08 DIAGNOSIS — K648 Other hemorrhoids: Secondary | ICD-10-CM | POA: Diagnosis not present

## 2023-02-08 MED ORDER — SODIUM CHLORIDE 0.9 % IV SOLN: 500.0000 mL | Freq: Once | INTRAVENOUS | Status: AC

## 2023-02-08 NOTE — Progress Notes (Signed)
 History and Physical:  This patient presents for endoscopic testing for: Encounter Diagnosis  Name Primary?   Special screening for malignant neoplasms, colon Yes    Average risk for colorectal cancer.  2nd screening exam. No polyps Dec 2013 Patient denies chronic abdominal pain, rectal bleeding, constipation or diarrhea.    Patient is otherwise without complaints or active issues today.   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     loratadine  (CLARITIN ) 10 MG tablet Take 10 mg by mouth daily.     metFORMIN  (GLUCOPHAGE -XR) 500 MG 24 hr tablet Take 1,500 mg by mouth daily.     Testosterone  20.25 MG/ACT (1.62%) GEL Apply 2 Pump topically daily. (Patient not taking: Reported on 02/08/2023)     Current Facility-Administered Medications  Medication Dose Route Frequency Provider Last Rate Last Admin   0.9 %  sodium chloride  infusion  500 mL Intravenous Once Danis, Luzelena Heeg L III, MD          ___________________________________________________________________ Objective   Exam:  BP 127/78   Pulse 70   Temp 98.3 F (36.8 C)   Resp 17   Ht 5' 9 (1.753 m)   Wt  225 lb (102.1 kg)   SpO2 98%   BMI 33.23 kg/m   CV: regular , S1/S2 Resp: clear to auscultation bilaterally, normal RR and effort noted GI: soft, no tenderness, with active bowel sounds.   Assessment: Encounter Diagnosis  Name Primary?   Special screening for malignant neoplasms, colon Yes     Plan: Colonoscopy   The benefits and risks of the planned procedure were described in detail with the patient or (when appropriate) their health care proxy.  Risks were outlined as including, but not limited to, bleeding, infection, perforation, adverse medication reaction leading to cardiac or pulmonary decompensation, pancreatitis (if ERCP).  The limitation of incomplete mucosal visualization was also discussed.  No guarantees or warranties were given.  The patient is appropriate for an endoscopic procedure in the ambulatory setting.   - Victory Brand, MD

## 2023-02-08 NOTE — Patient Instructions (Signed)
 DISCHARGE INSTRUCTIONS GIVEN. Handouts on Diverticulosis and Hemorrhoids. Resume previous medications. YOU HAD AN ENDOSCOPIC PROCEDURE TODAY AT THE  ENDOSCOPY CENTER:   Refer to the procedure report that was given to you for any specific questions about what was found during the examination.  If the procedure report does not answer your questions, please call your gastroenterologist to clarify.  If you requested that your care partner not be given the details of your procedure findings, then the procedure report has been included in a sealed envelope for you to review at your convenience later.  YOU SHOULD EXPECT: Some feelings of bloating in the abdomen. Passage of more gas than usual.  Walking can help get rid of the air that was put into your GI tract during the procedure and reduce the bloating. If you had a lower endoscopy (such as a colonoscopy or flexible sigmoidoscopy) you may notice spotting of blood in your stool or on the toilet paper. If you underwent a bowel prep for your procedure, you may not have a normal bowel movement for a few days.  Please Note:  You might notice some irritation and congestion in your nose or some drainage.  This is from the oxygen used during your procedure.  There is no need for concern and it should clear up in a day or so.  SYMPTOMS TO REPORT IMMEDIATELY:  Following lower endoscopy (colonoscopy or flexible sigmoidoscopy):  Excessive amounts of blood in the stool  Significant tenderness or worsening of abdominal pains  Swelling of the abdomen that is new, acute  Fever of 100F or higher   For urgent or emergent issues, a gastroenterologist can be reached at any hour by calling [phone]. Do not use MyChart messaging for urgent concerns.    DIET:  We do recommend a small meal at first, but then you may proceed to your regular diet.  Drink plenty of fluids but you should avoid alcoholic beverages for 24 hours.  ACTIVITY:  You should plan to  take it easy for the rest of today and you should NOT DRIVE or use heavy machinery until tomorrow (because of the sedation medicines used during the test).    FOLLOW UP: Our staff will call the number listed on your records the next business day following your procedure.  We will call around 7:15- 8:00 am to check on you and address any questions or concerns that you may have regarding the information given to you following your procedure. If we do not reach you, we will leave a message.     If any biopsies were taken you will be contacted by phone or by letter within the next 1-3 weeks.  Please call us  at [phone] if you have not heard about the biopsies in 3 weeks.    SIGNATURES/CONFIDENTIALITY: You and/or your care partner have signed paperwork which will be entered into your electronic medical record.  These signatures attest to the fact that that the information above on your After Visit Summary has been reviewed and is understood.  Full responsibility of the confidentiality of this discharge information lies with you and/or your care-partner.

## 2023-02-08 NOTE — Op Note (Signed)
 [facility] Patient Name: Bryan Bradley Procedure Date: 02/08/2023 1:09 PM MRN: [id number] Endoscopist: Victory L. Legrand , MD, [id number] Age: 63 Referring MD:  Date of Birth: 28-Apr-1960 Gender: Male Account #: [id number] Procedure:                Colonoscopy Indications:              Screening for colorectal malignant neoplasm                           no polyps Dec 2013 Medicines:                Monitored Anesthesia Care Procedure:                Pre-Anesthesia Assessment:                           - Prior to the procedure, a History and Physical                            was performed, and patient medications and                            allergies were reviewed. The patient's tolerance of                            previous anesthesia was also reviewed. The risks                            and benefits of the procedure and the sedation                            options and risks were discussed with the patient.                            All questions were answered, and informed consent                            was obtained. Prior Anticoagulants: The patient has                            taken no anticoagulant or antiplatelet agents. ASA                            Grade Assessment: II - A patient with mild systemic                            disease. After reviewing the risks and benefits,                            the patient was deemed in satisfactory condition to                            undergo the procedure.  After obtaining informed consent, the colonoscope                            was passed under direct vision. Throughout the                            procedure, the patient's blood pressure, pulse, and                            oxygen saturations were monitored continuously. The                            Olympus Scope SN [id number] was introduced through the                            anus and advanced to the the cecum, identified  by                            appendiceal orifice and ileocecal valve. The                            colonoscopy was performed without difficulty. The                            patient tolerated the procedure well. The quality                            of the bowel preparation was good. The ileocecal                            valve, appendiceal orifice, and rectum were                            photographed. Scope In: 1:51:24 PM Scope Out: 2:05:11 PM Scope Withdrawal Time: 0 hours 11 minutes 10 seconds  Total Procedure Duration: 0 hours 13 minutes 47 seconds  Findings:                 The perianal and digital rectal examinations were                            normal.                           Repeat examination of right colon under NBI                            performed.                           Internal hemorrhoids were found. The hemorrhoids                            were small.  Multiple diverticula were found in the left colon.                           The exam was otherwise without abnormality on                            direct and retroflexion views. Complications:            No immediate complications. Estimated Blood Loss:     Estimated blood loss: none. Impression:               - Internal hemorrhoids.                           - Diverticulosis in the left colon.                           - The examination was otherwise normal on direct                            and retroflexion views.                           - No specimens collected. Recommendation:           - Patient has a contact number available for                            emergencies. The signs and symptoms of potential                            delayed complications were discussed with the                            patient. Return to normal activities tomorrow.                            Written discharge instructions were provided to the                             patient.                           - Resume previous diet.                           - Continue present medications.                           - Repeat colonoscopy in 10 years for screening                            purposes. Meghanne Pletz L. Legrand, MD 02/08/2023 2:10:57 PM This report has been signed electronically.

## 2023-02-08 NOTE — Progress Notes (Signed)
 Sedate, gd SR, tolerated procedure well, VSS, report to RN

## 2023-02-09 ENCOUNTER — Telehealth: Payer: Self-pay | Admitting: *Deleted

## 2023-02-09 NOTE — Telephone Encounter (Signed)
  Follow up Call-     02/08/2023   12:35 PM  Call back number  Post procedure Call Back phone  # [phone]  Permission to leave phone message Yes     Patient questions:  Do you have a fever, pain , or abdominal swelling? No. Pain Score  0 *  Have you tolerated food without any problems? Yes.    Have you been able to return to your normal activities? Yes.    Do you have any questions about your discharge instructions: Diet   No. Medications  No. Follow up visit  No.  Do you have questions or concerns about your Care? No.  Actions: * If pain score is 4 or above: No action needed, pain <4.

## 2023-02-14 DIAGNOSIS — S060X0D Concussion without loss of consciousness, subsequent encounter: Secondary | ICD-10-CM | POA: Diagnosis not present

## 2023-03-09 DIAGNOSIS — L821 Other seborrheic keratosis: Secondary | ICD-10-CM | POA: Diagnosis not present

## 2023-03-09 DIAGNOSIS — L738 Other specified follicular disorders: Secondary | ICD-10-CM | POA: Diagnosis not present

## 2023-03-09 DIAGNOSIS — D225 Melanocytic nevi of trunk: Secondary | ICD-10-CM | POA: Diagnosis not present

## 2023-03-09 DIAGNOSIS — L0889 Other specified local infections of the skin and subcutaneous tissue: Secondary | ICD-10-CM | POA: Diagnosis not present

## 2023-04-28 DIAGNOSIS — E118 Type 2 diabetes mellitus with unspecified complications: Secondary | ICD-10-CM | POA: Diagnosis not present

## 2023-04-28 DIAGNOSIS — E782 Mixed hyperlipidemia: Secondary | ICD-10-CM | POA: Diagnosis not present

## 2023-04-28 DIAGNOSIS — S060X0A Concussion without loss of consciousness, initial encounter: Secondary | ICD-10-CM | POA: Diagnosis not present

## 2023-06-20 ENCOUNTER — Encounter: Payer: Self-pay | Admitting: Neurology

## 2023-06-20 ENCOUNTER — Ambulatory Visit: Admitting: Neurology

## 2023-06-20 VITALS — BP 133/77 | HR 77 | Resp 16 | Ht 69.0 in | Wt 211.5 lb

## 2023-06-20 DIAGNOSIS — Z9189 Other specified personal risk factors, not elsewhere classified: Secondary | ICD-10-CM

## 2023-06-20 DIAGNOSIS — F0781 Postconcussional syndrome: Secondary | ICD-10-CM | POA: Diagnosis not present

## 2023-06-20 DIAGNOSIS — G44209 Tension-type headache, unspecified, not intractable: Secondary | ICD-10-CM | POA: Diagnosis not present

## 2023-06-20 MED ORDER — VERAPAMIL HCL ER 120 MG PO TBCR: 120.0000 mg | EXTENDED_RELEASE_TABLET | Freq: Every day | ORAL | 0 refills | Status: DC

## 2023-06-20 MED ORDER — AMANTADINE HCL 100 MG PO CAPS: 100.0000 mg | ORAL_CAPSULE | Freq: Every day | ORAL | 0 refills | Status: DC

## 2023-06-20 NOTE — Patient Instructions (Addendum)
 Trial of Verapamil as headaches prevention  Please use Aleve as needed for the headaches, please do not take more than 3 days a week  Use Amantadine 100 mg daily to promote wakefulness and focus  MRI Brain without contrast  Please contact me for updates  Return as needed
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
## 2023-07-13 ENCOUNTER — Other Ambulatory Visit: Payer: Self-pay | Admitting: Neurology

## 2023-07-18 ENCOUNTER — Ambulatory Visit: Payer: Self-pay | Admitting: Neurology

## 2023-08-10 DIAGNOSIS — E118 Type 2 diabetes mellitus with unspecified complications: Secondary | ICD-10-CM | POA: Diagnosis not present

## 2023-08-15 ENCOUNTER — Telehealth: Payer: Self-pay | Admitting: Neurology

## 2023-08-15 NOTE — Telephone Encounter (Signed)
 Pt called to schedule his MRI

## 2023-08-24 DIAGNOSIS — R0981 Nasal congestion: Secondary | ICD-10-CM | POA: Diagnosis not present

## 2023-08-24 DIAGNOSIS — H6691 Otitis media, unspecified, right ear: Secondary | ICD-10-CM | POA: Diagnosis not present

## 2023-08-30 ENCOUNTER — Ambulatory Visit

## 2023-08-30 DIAGNOSIS — G44209 Tension-type headache, unspecified, not intractable: Secondary | ICD-10-CM

## 2023-09-02 ENCOUNTER — Ambulatory Visit: Payer: Self-pay | Admitting: Neurology

## 2023-09-15 NOTE — Telephone Encounter (Signed)
 Please double book him Tuesday Sept 14 at 400 pm to review his MRI brain. Thanks

## 2023-09-20 ENCOUNTER — Telehealth (INDEPENDENT_AMBULATORY_CARE_PROVIDER_SITE_OTHER): Admitting: Neurology

## 2023-09-20 ENCOUNTER — Encounter: Payer: Self-pay | Admitting: Neurology

## 2023-09-20 DIAGNOSIS — F0781 Postconcussional syndrome: Secondary | ICD-10-CM | POA: Diagnosis not present

## 2023-09-20 DIAGNOSIS — G44209 Tension-type headache, unspecified, not intractable: Secondary | ICD-10-CM | POA: Diagnosis not present

## 2023-09-20 MED ORDER — AMITRIPTYLINE HCL 25 MG PO TABS: 25.0000 mg | ORAL_TABLET | Freq: Every day | ORAL | 0 refills | Status: DC
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
## 2023-09-20 NOTE — Patient Instructions (Addendum)
 Discontinue Amantadine   Discontinue verapamil  Start amitriptyline  25 mg, half tablet nightly for 1 week then increase to full tablet Please contact me if you do have any side effect from the medication Follow-up in a month video visit or sooner if worse.

## 2023-10-14 ENCOUNTER — Other Ambulatory Visit: Payer: Self-pay | Admitting: Neurology

## 2023-12-02 DIAGNOSIS — R059 Cough, unspecified: Secondary | ICD-10-CM | POA: Diagnosis not present

## 2023-12-02 DIAGNOSIS — Z03818 Encounter for observation for suspected exposure to other biological agents ruled out: Secondary | ICD-10-CM | POA: Diagnosis not present

## 2023-12-02 DIAGNOSIS — H6692 Otitis media, unspecified, left ear: Secondary | ICD-10-CM | POA: Diagnosis not present

## 2023-12-12 DIAGNOSIS — J019 Acute sinusitis, unspecified: Secondary | ICD-10-CM | POA: Diagnosis not present

## 2023-12-12 DIAGNOSIS — R0981 Nasal congestion: Secondary | ICD-10-CM | POA: Diagnosis not present

## 2023-12-12 DIAGNOSIS — J029 Acute pharyngitis, unspecified: Secondary | ICD-10-CM | POA: Diagnosis not present

## 2023-12-12 DIAGNOSIS — R059 Cough, unspecified: Secondary | ICD-10-CM | POA: Diagnosis not present

## 2023-12-13 DIAGNOSIS — E782 Mixed hyperlipidemia: Secondary | ICD-10-CM | POA: Diagnosis not present

## 2023-12-13 DIAGNOSIS — Z5181 Encounter for therapeutic drug level monitoring: Secondary | ICD-10-CM | POA: Diagnosis not present

## 2023-12-13 DIAGNOSIS — Z125 Encounter for screening for malignant neoplasm of prostate: Secondary | ICD-10-CM | POA: Diagnosis not present

## 2023-12-13 DIAGNOSIS — Z0000 Encounter for general adult medical examination without abnormal findings: Secondary | ICD-10-CM | POA: Diagnosis not present

## 2023-12-13 DIAGNOSIS — E291 Testicular hypofunction: Secondary | ICD-10-CM | POA: Diagnosis not present

## 2023-12-13 DIAGNOSIS — E118 Type 2 diabetes mellitus with unspecified complications: Secondary | ICD-10-CM | POA: Diagnosis not present

## 2024-01-18 ENCOUNTER — Other Ambulatory Visit: Payer: Self-pay

## 2024-01-18 ENCOUNTER — Other Ambulatory Visit: Payer: Self-pay | Admitting: Neurology
